# Patient Record
Sex: Female | Born: 2000
Health system: Southern US, Community
[De-identification: ages and names within clinical notes are randomized; demographics above are authoritative.]

## PROBLEM LIST (undated history)

## (undated) DIAGNOSIS — F32A Depression, unspecified: Secondary | ICD-10-CM

## (undated) DIAGNOSIS — F419 Anxiety disorder, unspecified: Secondary | ICD-10-CM

## (undated) DIAGNOSIS — D649 Anemia, unspecified: Secondary | ICD-10-CM

## (undated) DIAGNOSIS — N7011 Chronic salpingitis: Secondary | ICD-10-CM

## (undated) DIAGNOSIS — F329 Major depressive disorder, single episode, unspecified: Secondary | ICD-10-CM

## (undated) HISTORY — DX: Anxiety disorder, unspecified: F41.9

---
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
## 2018-01-08 MED FILL — NORETHIND-ETH ESTRAD 1-0.02: 1-20 | 63 days supply | Qty: 63 | Fill #3

## 2018-01-15 MED FILL — ESCITALOPRAM 20 MG TABLET: 20 | 10 days supply | Qty: 10 | Fill #1

## 2018-02-05 MED FILL — ESCITALOPRAM 20 MG TABLET: 20 | 90 days supply | Qty: 90 | Fill #2

## 2018-04-19 MED FILL — NORETHIND-ETH ESTRAD 1-0.02: 1-20 | 63 days supply | Qty: 63 | Fill #4

## 2018-04-25 ENCOUNTER — Encounter: Payer: Self-pay | Admitting: Family Medicine

## 2018-04-25 ENCOUNTER — Ambulatory Visit: Payer: 59 | Admitting: Family Medicine

## 2018-04-25 VITALS — BP 120/74 | HR 57 | Temp 98.6°F | Ht 65.5 in | Wt 153.2 lb

## 2018-04-25 DIAGNOSIS — F418 Other specified anxiety disorders: Secondary | ICD-10-CM | POA: Diagnosis not present

## 2018-04-25 MED ORDER — BUSPIRONE HCL 15 MG PO TABS: 15.0000 mg | ORAL_TABLET | Freq: Two times a day (BID) | ORAL | 3 refills | Status: DC

## 2018-04-25 MED ORDER — SERTRALINE HCL 50 MG PO TABS: 50.0000 mg | ORAL_TABLET | Freq: Every day | ORAL | 3 refills | Status: DC

## 2018-04-25 MED ORDER — ALPRAZOLAM 0.5 MG PO TABS: 0.5000 mg | ORAL_TABLET | Freq: Three times a day (TID) | ORAL | 0 refills | Status: DC | PRN

## 2018-04-25 NOTE — Patient Instructions (Addendum)
Go back to the zoloft 50 mg ( stop the lexapro)   Also add buspar - start with 1/2 pill twice daily , then if doing ok after 3-7 days increase to a whole pill twice daily   Then use xanax for emergencies/severe anxiety - up to three times a day    We will call you about referral for counseling   I hope your trip goes well   Be careful with caffeine   Follow up with me before you go back to school if you can

## 2018-04-25 NOTE — Progress Notes (Signed)
Subjective:    Patient ID: Rachel [name], female    DOB: February 20, 2001, 17 y.o.   MRN: [id number]  HPI Here for anxiety   Has seen a counselor in the past  Has taken zoloft and then changed to [place] she got worse instead of better  The zoloft and lexapro make her less irritable but she worries that they worsen her panic attacks  Also help with depressed mood    Is home from school and about to go on a long trip  (she does not like leaving home)  Going to [place] - wants to be excited about that   [place]- with grandparents [place]   (parents will not be on that leg of the trip) Family is not very helpful -saying she is being dramatic  Dad listens to her   Adjusting to being home from college  First day -rough/panic attacks  Now she has some bad days/some better   Panic attacks last 3 hours at least  She has tried meditation apps and journaling and keeping busy -just does not work   Wants to be set up with counseling when able    Has had counseling   Caffeine intake - does have some -not every day    Exercise -tennis and crossfit (enjoys it)   Patient Active Problem List   Diagnosis Date Noted  . Encounter for sickle-cell screening 06/05/2017  . Stress reaction 01/19/2016  . Well adolescent visit 10/26/2012  . Anxiety associated with depression 08/02/2011   Past Medical History:  Diagnosis Date  . Anxiety    Past Surgical History:  Procedure Laterality Date  . TONSILLECTOMY AND ADENOIDECTOMY  2006   Social History   Tobacco Use  . Smoking status: Never Smoker  . Smokeless tobacco: Never Used  Substance Use Topics  . Alcohol use: No    Alcohol/week: 0.0 oz  . Drug use: No   Family History  Problem Relation Age of Onset  . Anxiety disorder Mother   . Anxiety disorder Father   . Cancer Maternal Grandmother        breast CA  . Hyperlipidemia Maternal Grandmother   . Hypertension Maternal Grandfather   . Diabetes Paternal Grandmother   .  Hypertension Paternal Grandfather    No Known Allergies Current Outpatient Medications on File Prior to Visit  Medication Sig Dispense Refill  . Norgestimate-Eth Estradiol (ORTHO-CYCLEN, 28, PO) Take 1 tablet by mouth daily.     No current facility-administered medications on file prior to visit.     Review of Systems  Constitutional: Negative for activity change, appetite change, fatigue, fever and unexpected weight change.  HENT: Negative for congestion, ear pain, rhinorrhea, sinus pressure and sore throat.   Eyes: Negative for pain, redness and visual disturbance.  Respiratory: Negative for cough, shortness of breath and wheezing.   Cardiovascular: Negative for chest pain and palpitations.  Gastrointestinal: Negative for abdominal pain, blood in stool, constipation and diarrhea.  Endocrine: Negative for polydipsia and polyuria.  Genitourinary: Negative for dysuria, frequency and urgency.  Musculoskeletal: Negative for arthralgias, back pain and myalgias.  Skin: Negative for pallor and rash.  Allergic/Immunologic: Negative for environmental allergies.  Neurological: Negative for dizziness, syncope and headaches.  Hematological: Negative for adenopathy. Does not bruise/bleed easily.  Psychiatric/Behavioral: Positive for dysphoric mood. Negative for decreased concentration. The patient is nervous/anxious.        Objective:   Physical Exam  Constitutional: She appears well-developed and well-nourished. No distress.  Well  appearing   HENT:  Head: Normocephalic and atraumatic.  Mouth/Throat: Oropharynx is clear and moist.  Eyes: Pupils are equal, round, and reactive to light. Conjunctivae and EOM are normal.  Neck: Normal range of motion. Neck supple. No JVD present. Carotid bruit is not present. No thyromegaly present.  Cardiovascular: Normal rate, regular rhythm, normal heart sounds and intact distal pulses. Exam reveals no gallop.  Pulmonary/Chest: Effort normal and breath sounds  normal. No respiratory distress. She has no wheezes. She has no rales.  No crackles  Abdominal: Soft. Bowel sounds are normal. She exhibits no distension, no abdominal bruit and no mass. There is no tenderness.  Musculoskeletal: She exhibits no edema.  Lymphadenopathy:    She has no cervical adenopathy.  Neurological: She is alert. She has normal reflexes. She displays normal reflexes. No cranial nerve deficit. She exhibits normal muscle tone. Coordination normal.  Mild anxious hand tremor   Skin: Skin is warm and dry. Capillary refill takes less than 2 seconds. No rash noted.  Psychiatric: Her speech is normal and behavior is normal. Thought content normal. Her mood appears anxious. She exhibits a depressed mood.  Candid when discussing stressors Not tearful She is attentive.          Assessment & Plan:   Problem List Items Addressed This Visit      Other   Anxiety associated with depression - Primary    Worsening panic attacks with lexapro (although does well when not around parents and at school) Anxiety about upcoming trip out of the country Reviewed stressors/ coping techniques/symptoms/ support sources/ tx options and side effects in detail today Will change back to zoloft 50 mg  Add buspar 15 mg bid  Xanax for emergencies Ref for counseling (she already has a Social worker at school)  F/u end of school  Update if worse or no improvement       Relevant Medications   ALPRAZolam (XANAX) 0.5 MG tablet   sertraline (ZOLOFT) 50 MG tablet   busPIRone (BUSPAR) 15 MG tablet   Other Relevant Orders   Ambulatory referral to Psychology

## 2018-04-26 MED FILL — ALPRAZolam 0.5 MG TABS: 0.5 | 20 days supply | Qty: 60 | Fill #0

## 2018-04-26 MED FILL — busPIRone HCL 15 MG TABS: 15 | 90 days supply | Qty: 180 | Fill #0

## 2018-04-26 MED FILL — SERTRALINE HCL 50 MG TABLET: 50 | 90 days supply | Qty: 90 | Fill #0

## 2018-04-29 NOTE — Assessment & Plan Note (Signed)
Worsening panic attacks with lexapro (although does well when not around parents and at school) Anxiety about upcoming trip out of the country Reviewed stressors/ coping techniques/symptoms/ support sources/ tx options and side effects in detail today Will change back to zoloft 50 mg  Add buspar 15 mg bid  Xanax for emergencies Ref for counseling (she already has a Social worker at school)  F/u end of school  Update if worse or no improvement

## 2018-06-13 DIAGNOSIS — F411 Generalized anxiety disorder: Secondary | ICD-10-CM | POA: Diagnosis not present

## 2018-06-13 DIAGNOSIS — F331 Major depressive disorder, recurrent, moderate: Secondary | ICD-10-CM | POA: Diagnosis not present

## 2018-06-25 ENCOUNTER — Encounter: Payer: Self-pay | Admitting: Family Medicine

## 2018-06-25 ENCOUNTER — Ambulatory Visit: Payer: 59 | Admitting: Family Medicine

## 2018-06-25 VITALS — BP 118/84 | HR 82 | Temp 98.3°F | Ht 65.5 in | Wt 150.0 lb

## 2018-06-25 DIAGNOSIS — F418 Other specified anxiety disorders: Secondary | ICD-10-CM

## 2018-06-25 DIAGNOSIS — F331 Major depressive disorder, recurrent, moderate: Secondary | ICD-10-CM | POA: Diagnosis not present

## 2018-06-25 DIAGNOSIS — F411 Generalized anxiety disorder: Secondary | ICD-10-CM | POA: Diagnosis not present

## 2018-06-25 MED ORDER — SERTRALINE HCL 100 MG PO TABS: 100.0000 mg | ORAL_TABLET | Freq: Every day | ORAL | 3 refills | Status: DC

## 2018-06-25 NOTE — Progress Notes (Signed)
Subjective:    Patient ID: Rachel [name], female    DOB: 11-06-2001, 17 y.o.   MRN: [id number]  HPI Here for anxiety/depression symptoms  Ongoing problem  Worse around parents or when traveling Panic attacks -hard to control   Last visit went back to zoloft (from lexapro)  Added buspar 15 mg bid Xanax for emergencies  Ref for counseling  Poor sleep  No appetite   Wt Readings from Last 3 Encounters:  06/25/18 150 lb (68 kg) (81 %, Z= 0.89)*  04/25/18 153 lb 4 oz (69.5 kg) (84 %, Z= 1.00)*  11/08/17 146 lb 6.4 oz (66.4 kg) (80 %, Z= 0.84)*   * Growth percentiles are based on CDC (Girls, 2-20 Years) data.   24.58 kg/m (77 %, Z= 0.75, Source: CDC (Girls, 2-20 Years))   Here with her mother  Rachel [name] to the beach last week- mood was poor/ very anxious  Had to take xanax  Feels depressed a lot of the time   Still having panic attacks and they last a long time   Last med change did help -until recently  More depressed now   Goes back to school in 10 days  Worried about feeling this bad at school   Biggest stressor-issue with boyfriend -- does not feel the same   Has a therapist appointment in [place]   Patient Active Problem List   Diagnosis Date Noted  . Encounter for sickle-cell screening 06/05/2017  . Stress reaction 01/19/2016  . Well adolescent visit 10/26/2012  . Anxiety associated with depression 08/02/2011   Past Medical History:  Diagnosis Date  . Anxiety    Past Surgical History:  Procedure Laterality Date  . TONSILLECTOMY AND ADENOIDECTOMY  2006   Social History   Tobacco Use  . Smoking status: Never Smoker  . Smokeless tobacco: Never Used  Substance Use Topics  . Alcohol use: No    Alcohol/week: 0.0 standard drinks  . Drug use: No   Family History  Problem Relation Age of Onset  . Anxiety disorder Mother   . Anxiety disorder Father   . Cancer Maternal Grandmother        breast CA  . Hyperlipidemia Maternal Grandmother   .  Hypertension Maternal Grandfather   . Diabetes Paternal Grandmother   . Hypertension Paternal Grandfather    No Known Allergies Current Outpatient Medications on File Prior to Visit  Medication Sig Dispense Refill  . ALPRAZolam (XANAX) 0.5 MG tablet Take 1 tablet (0.5 mg total) by mouth 3 (three) times daily as needed for anxiety. 60 tablet 0  . busPIRone (BUSPAR) 15 MG tablet Take 1 tablet (15 mg total) by mouth 2 (two) times daily. 180 tablet 3  . Norgestimate-Eth Estradiol (ORTHO-CYCLEN, 28, PO) Take 1 tablet by mouth daily.     No current facility-administered medications on file prior to visit.     Review of Systems  Constitutional: Positive for fatigue. Negative for activity change, appetite change, fever and unexpected weight change.  HENT: Negative for congestion, ear pain, rhinorrhea, sinus pressure and sore throat.   Eyes: Negative for pain, redness and visual disturbance.  Respiratory: Negative for cough, shortness of breath and wheezing.   Cardiovascular: Negative for chest pain and palpitations.  Gastrointestinal: Negative for abdominal pain, blood in stool, constipation and diarrhea.  Endocrine: Negative for polydipsia and polyuria.  Genitourinary: Negative for dysuria, frequency and urgency.  Musculoskeletal: Negative for arthralgias, back pain and myalgias.  Skin: Negative for pallor and rash.  Allergic/Immunologic: Negative for environmental allergies.  Neurological: Negative for dizziness, syncope and headaches.  Hematological: Negative for adenopathy. Does not bruise/bleed easily.  Psychiatric/Behavioral: Positive for decreased concentration, dysphoric mood and sleep disturbance. Negative for self-injury and suicidal ideas. The patient is nervous/anxious.        Objective:   Physical Exam  Constitutional: She is oriented to person, place, and time. She appears well-developed and well-nourished. No distress.  Well appearing but down and occ tearful  HENT:  Head:  Normocephalic and atraumatic.  Eyes: Pupils are equal, round, and reactive to light. Conjunctivae are normal.  Neck: Normal range of motion. Neck supple. No thyromegaly present.  Cardiovascular: Normal rate, regular rhythm and normal heart sounds.  Pulmonary/Chest: Effort normal and breath sounds normal. No stridor. No respiratory distress. She has no wheezes.  Lymphadenopathy:    She has no cervical adenopathy.  Neurological: She is alert and oriented to person, place, and time. She displays no tremor and normal reflexes. No cranial nerve deficit. Coordination normal.  Skin: Skin is warm and dry.  Psychiatric: Her mood appears anxious. Her speech is delayed. She is slowed and withdrawn. She is not actively hallucinating. Thought content is not paranoid. Cognition and memory are normal. She exhibits a depressed mood. She expresses no homicidal and no suicidal ideation.  Tearful at times- slow to answer questions and more withdrawn than usual  Supportive mother is with her today helping with history   She is attentive.          Assessment & Plan:   Problem List Items Addressed This Visit      Other   Anxiety associated with depression - Primary    While anxiety was better controlled with combo of zoloft 50 and buspar -now depression is worsening  Anhedonia and difficulty sleeping/eating along with tearfulness  ? If issues with her boyfriend -making her sad  Sees her counselor in [place] today  Reviewed stressors/ coping techniques/symptoms/ support sources/ tx options and side effects in detail today (no thoughts of suicide or self harm)  Has good support  Disc ref to psychiatry with pt and her mother  They will see who her counselor recommends in that area (where she goes to school) and let us know so I can put in the referral  Disc s/s of SI with her mother/ family is attentive and supportive - inst to watch closely for worsening symptoms Will inc her sertraline to 75 mg for 1-2  weeks , then if tolerated 100 mg daily  Discussed expectations of SSRI medication including time to effectiveness and mechanism of action, also poss of side effects (early and late)- including mental fuzziness, weight or appetite change, nausea and poss of worse dep or anxiety (even suicidal thoughts)  Pt voiced understanding and will stop med and update if this occurs  (has tolerated it well so far)       Relevant Medications   sertraline (ZOLOFT) 100 MG tablet

## 2018-06-25 NOTE — Patient Instructions (Addendum)
Talk to your counselor today about available psychiatrists in that area and let me know/ give [place] some names  We can refer   Increase zoloft to 75 mg (1 1/2 pills) for 1-2 weeks -if no problems then go up to 2 pills daily (100 mg)  I will write a new px for the 100 mg pill   If suddenly worse or you feel suicidal-please alert [place] and get the the emergency room   Try to take care of yourself - keep talking to friends and family

## 2018-06-25 NOTE — Assessment & Plan Note (Signed)
While anxiety was better controlled with combo of zoloft 50 and buspar -now depression is worsening  Anhedonia and difficulty sleeping/eating along with tearfulness  ? If issues with her boyfriend -making her sad  Sees her counselor in [place] today  Reviewed stressors/ coping techniques/symptoms/ support sources/ tx options and side effects in detail today (no thoughts of suicide or self harm)  Has good support  Disc ref to psychiatry with pt and her mother  They will see who her counselor recommends in that area (where she goes to school) and let us know so I can put in the referral  Disc s/s of SI with her mother/ family is attentive and supportive - inst to watch closely for worsening symptoms Will inc her sertraline to 75 mg for 1-2 weeks , then if tolerated 100 mg daily  Discussed expectations of SSRI medication including time to effectiveness and mechanism of action, also poss of side effects (early and late)- including mental fuzziness, weight or appetite change, nausea and poss of worse dep or anxiety (even suicidal thoughts)  Pt voiced understanding and will stop med and update if this occurs  (has tolerated it well so far)

## 2018-06-26 ENCOUNTER — Telehealth: Payer: Self-pay

## 2018-06-26 NOTE — Telephone Encounter (Signed)
Copied from [place] [id number]. Topic: Referral - Question >> Jun 25, 2018  4:44 PM Antonieta Iba C wrote: Reason for CRM: Pt's mom called in to provide the psychiatrist information for a referral.    [facility]   Fax: [phone]    phone : [phone]  This is the location detail. Mom would like to be called once referral is placed.  Randolm Idol- [phone]  Spoke with patient's mother Randolm Idol and advised referral faxed over and to let us know what provider and when patient will see them once this is set up.-Anastasiya Estell Harpin, RMA

## 2018-06-27 ENCOUNTER — Ambulatory Visit: Payer: Self-pay | Admitting: Family Medicine

## 2018-06-27 DIAGNOSIS — F331 Major depressive disorder, recurrent, moderate: Secondary | ICD-10-CM | POA: Diagnosis not present

## 2018-06-27 DIAGNOSIS — F411 Generalized anxiety disorder: Secondary | ICD-10-CM | POA: Diagnosis not present

## 2018-07-04 DIAGNOSIS — F331 Major depressive disorder, recurrent, moderate: Secondary | ICD-10-CM | POA: Diagnosis not present

## 2018-07-04 DIAGNOSIS — F411 Generalized anxiety disorder: Secondary | ICD-10-CM | POA: Diagnosis not present

## 2018-07-05 DIAGNOSIS — F411 Generalized anxiety disorder: Secondary | ICD-10-CM | POA: Diagnosis not present

## 2018-07-05 MED FILL — NORETHIND-ETH ESTRAD 1-0.02: 1-20 | 63 days supply | Qty: 63 | Fill #0

## 2018-07-05 MED FILL — CLORAZEPATE 7.5 MG TABLET: 7.5 | 30 days supply | Qty: 90 | Fill #0

## 2018-07-05 MED FILL — buPROPion HCL ER (XL) 150 M: 150 | 30 days supply | Qty: 30 | Fill #0

## 2018-07-09 MED FILL — SERTRALINE HCL 50 MG TABLET: 50 | 30 days supply | Qty: 30 | Fill #0

## 2018-07-26 DIAGNOSIS — F332 Major depressive disorder, recurrent severe without psychotic features: Secondary | ICD-10-CM | POA: Diagnosis not present

## 2018-08-06 DIAGNOSIS — F332 Major depressive disorder, recurrent severe without psychotic features: Secondary | ICD-10-CM | POA: Diagnosis not present

## 2018-08-09 DIAGNOSIS — F332 Major depressive disorder, recurrent severe without psychotic features: Secondary | ICD-10-CM | POA: Diagnosis not present

## 2018-08-24 DIAGNOSIS — F332 Major depressive disorder, recurrent severe without psychotic features: Secondary | ICD-10-CM | POA: Diagnosis not present

## 2018-08-24 MED FILL — buPROPion HCL ER (XL) 300 M: 300 | 90 days supply | Qty: 90 | Fill #0

## 2018-08-24 MED FILL — ALPRAZolam 0.5 MG TABS: 0.5 | 20 days supply | Qty: 20 | Fill #0

## 2018-08-24 MED FILL — DESVENLAFAXINE SUCCINATE ER: 25 | 7 days supply | Qty: 7 | Fill #0

## 2018-08-24 MED FILL — DESVENLAFAXINE SUC ER 50 MG: 50 | 90 days supply | Qty: 90 | Fill #0

## 2018-09-14 DIAGNOSIS — F332 Major depressive disorder, recurrent severe without psychotic features: Secondary | ICD-10-CM | POA: Diagnosis not present

## 2018-09-14 MED FILL — NORETHIND-ETH ESTRAD 1-0.02: 1-20 | 63 days supply | Qty: 63 | Fill #0

## 2018-09-24 DIAGNOSIS — F332 Major depressive disorder, recurrent severe without psychotic features: Secondary | ICD-10-CM | POA: Diagnosis not present

## 2018-10-29 DIAGNOSIS — Z01419 Encounter for gynecological examination (general) (routine) without abnormal findings: Secondary | ICD-10-CM | POA: Diagnosis not present

## 2018-10-29 DIAGNOSIS — Z113 Encounter for screening for infections with a predominantly sexual mode of transmission: Secondary | ICD-10-CM | POA: Diagnosis not present

## 2018-10-29 DIAGNOSIS — Z6825 Body mass index (BMI) 25.0-25.9, adult: Secondary | ICD-10-CM | POA: Diagnosis not present

## 2018-10-29 DIAGNOSIS — Z1389 Encounter for screening for other disorder: Secondary | ICD-10-CM | POA: Diagnosis not present

## 2018-10-29 DIAGNOSIS — Z3040 Encounter for surveillance of contraceptives, unspecified: Secondary | ICD-10-CM | POA: Diagnosis not present

## 2018-11-12 MED FILL — NORETHIND-ETH ESTRAD 1-0.02: 1-20 | 63 days supply | Qty: 63 | Fill #0

## 2019-01-01 DIAGNOSIS — F332 Major depressive disorder, recurrent severe without psychotic features: Secondary | ICD-10-CM | POA: Diagnosis not present

## 2019-01-01 MED FILL — DESVENLAFAXINE SUC ER 100 M: 100 | 90 days supply | Qty: 90 | Fill #0

## 2019-01-01 MED FILL — ALPRAZolam 0.5 MG TABS: 0.5 | 20 days supply | Qty: 20 | Fill #0

## 2019-01-31 MED FILL — NORETHIND-ETH ESTRAD 1-0.02: 1-20 | 63 days supply | Qty: 63 | Fill #0

## 2019-02-22 MED FILL — buPROPion HCL ER (XL) 300 M: 300 | 90 days supply | Qty: 90 | Fill #0

## 2019-02-22 MED FILL — DESVENLAFAXINE SUC ER 100 M: 100 | 90 days supply | Qty: 90 | Fill #1

## 2019-03-13 ENCOUNTER — Telehealth: Payer: Self-pay | Admitting: *Deleted

## 2019-03-13 ENCOUNTER — Ambulatory Visit (INDEPENDENT_AMBULATORY_CARE_PROVIDER_SITE_OTHER): Payer: 59

## 2019-03-13 ENCOUNTER — Other Ambulatory Visit: Payer: Self-pay

## 2019-03-13 DIAGNOSIS — Z23 Encounter for immunization: Secondary | ICD-10-CM | POA: Diagnosis not present

## 2019-03-13 NOTE — Telephone Encounter (Signed)
Aware- let me know / we can do it drive through  Also she needs to watch closely for signs of infection-redness/pain /drainage Thanks

## 2019-03-13 NOTE — Telephone Encounter (Signed)
Please give Tdap  Adv to keep wound clean with soap and water- frequently and keep [place] updated

## 2019-03-13 NOTE — Telephone Encounter (Signed)
Patient notified as instructed by telephone and verbalized understanding. Nurse visit scheduled for today at 3:30.

## 2019-03-13 NOTE — Telephone Encounter (Signed)
FYI Patient called wanting to know when she had her last tetanus shot. Patient stated that she just stepped on a rusty nail. Advised patient there is not one listed on her immunization list. Patient stated that she may have had one before she went to college two years ago. Patient stated that she is going to check with her mom and will call back if she has not had one recently.

## 2019-03-13 NOTE — Telephone Encounter (Signed)
Patient called back stating that she was doing a hand stand and she actually poked her hand with a rusty nail. Patient stated that her mom told her if she did not have the vaccine here she would have not had one.

## 2019-04-18 MED FILL — NORETHIND-ETH ESTRAD 1-0.02: 1-20 | 63 days supply | Qty: 63 | Fill #1

## 2019-05-07 DIAGNOSIS — R140 Abdominal distension (gaseous): Secondary | ICD-10-CM | POA: Diagnosis not present

## 2019-05-07 DIAGNOSIS — R1033 Periumbilical pain: Secondary | ICD-10-CM | POA: Diagnosis not present

## 2019-05-17 MED FILL — buPROPion HCL ER (XL) 300 M: 300 | 90 days supply | Qty: 90 | Fill #1

## 2019-05-23 ENCOUNTER — Other Ambulatory Visit (HOSPITAL_COMMUNITY): Payer: Self-pay | Admitting: Gastroenterology

## 2019-05-23 ENCOUNTER — Other Ambulatory Visit: Payer: Self-pay | Admitting: Gastroenterology

## 2019-05-23 DIAGNOSIS — R1033 Periumbilical pain: Secondary | ICD-10-CM | POA: Diagnosis not present

## 2019-05-23 DIAGNOSIS — K5900 Constipation, unspecified: Secondary | ICD-10-CM | POA: Diagnosis not present

## 2019-05-28 DIAGNOSIS — F332 Major depressive disorder, recurrent severe without psychotic features: Secondary | ICD-10-CM | POA: Diagnosis not present

## 2019-05-28 MED FILL — DESVENLAFAXINE SUC ER 100 M: 100 | 90 days supply | Qty: 90 | Fill #0

## 2019-05-30 ENCOUNTER — Other Ambulatory Visit: Payer: Self-pay

## 2019-05-30 ENCOUNTER — Encounter (HOSPITAL_COMMUNITY): Payer: Self-pay | Admitting: Radiology

## 2019-05-30 ENCOUNTER — Ambulatory Visit (HOSPITAL_COMMUNITY)
Admission: RE | Admit: 2019-05-30 | Discharge: 2019-05-30 | Disposition: A | Discharge status: Home / Self Care | Payer: 59 | Source: Ambulatory Visit | Attending: Gastroenterology | Admitting: Gastroenterology

## 2019-05-30 DIAGNOSIS — N838 Other noninflammatory disorders of ovary, fallopian tube and broad ligament: Secondary | ICD-10-CM | POA: Diagnosis not present

## 2019-05-30 DIAGNOSIS — R1033 Periumbilical pain: Secondary | ICD-10-CM | POA: Diagnosis not present

## 2019-05-30 MED ORDER — SODIUM CHLORIDE (PF) 0.9 % IJ SOLN
INTRAMUSCULAR | Status: AC
  Filled 2019-05-30: qty 50

## 2019-05-30 MED ORDER — IOHEXOL 300 MG/ML  SOLN
100.0000 mL | Freq: Once | INTRAMUSCULAR | Status: AC | PRN
  Administered 2019-05-30: 13:00:00 100 mL via INTRAVENOUS

## 2019-06-05 ENCOUNTER — Encounter: Payer: Self-pay | Admitting: Family Medicine

## 2019-06-05 DIAGNOSIS — D7389 Other diseases of spleen: Secondary | ICD-10-CM | POA: Insufficient documentation

## 2019-06-27 DIAGNOSIS — N83291 Other ovarian cyst, right side: Secondary | ICD-10-CM | POA: Diagnosis not present

## 2019-07-01 MED FILL — NORETHIND-ETH ESTRAD 1-0.02: 1-20 | 63 days supply | Qty: 63 | Fill #2

## 2019-08-16 MED FILL — buPROPion HCL ER (XL) 300 M: 300 | 90 days supply | Qty: 90 | Fill #0

## 2019-08-20 MED FILL — DESVENLAFAXINE SUC ER 100 M: 100 | 90 days supply | Qty: 90 | Fill #0

## 2019-08-26 DIAGNOSIS — F332 Major depressive disorder, recurrent severe without psychotic features: Secondary | ICD-10-CM | POA: Diagnosis not present

## 2019-09-11 MED FILL — NORETHIND-ETH ESTRAD 1-0.02: 1-20 | 63 days supply | Qty: 63 | Fill #3

## 2019-09-24 DIAGNOSIS — Z20828 Contact with and (suspected) exposure to other viral communicable diseases: Secondary | ICD-10-CM | POA: Diagnosis not present

## 2019-11-01 DIAGNOSIS — N83201 Unspecified ovarian cyst, right side: Secondary | ICD-10-CM | POA: Diagnosis not present

## 2019-11-11 DIAGNOSIS — Z3040 Encounter for surveillance of contraceptives, unspecified: Secondary | ICD-10-CM | POA: Diagnosis not present

## 2019-11-11 DIAGNOSIS — Z01419 Encounter for gynecological examination (general) (routine) without abnormal findings: Secondary | ICD-10-CM | POA: Diagnosis not present

## 2019-11-11 DIAGNOSIS — Z1389 Encounter for screening for other disorder: Secondary | ICD-10-CM | POA: Diagnosis not present

## 2019-11-11 DIAGNOSIS — Z6823 Body mass index (BMI) 23.0-23.9, adult: Secondary | ICD-10-CM | POA: Diagnosis not present

## 2019-11-11 DIAGNOSIS — Z130 Encounter for screening for diseases of the blood and blood-forming organs and certain disorders involving the immune mechanism: Secondary | ICD-10-CM | POA: Diagnosis not present

## 2019-11-11 MED FILL — NORETHIND-ETH ESTRAD 1-0.02: 1-20 | 84 days supply | Qty: 84 | Fill #0

## 2019-11-22 MED FILL — buPROPion HCL ER (XL) 300 M: 300 | 30 days supply | Qty: 30 | Fill #0

## 2019-11-25 DIAGNOSIS — Z1159 Encounter for screening for other viral diseases: Secondary | ICD-10-CM | POA: Diagnosis not present

## 2019-12-08 ENCOUNTER — Telehealth: Payer: Self-pay | Admitting: Family Medicine

## 2019-12-08 NOTE — Telephone Encounter (Signed)
The radiologist recommended re imaging spleen in 6 months from last CT scan  She is due whenever she wants to schedule that Let me know when she is ready

## 2019-12-09 NOTE — Telephone Encounter (Signed)
Left VM requesting pt to call the office back 

## 2019-12-10 NOTE — Telephone Encounter (Signed)
Left 2nd VM requesting pt to call the office back  

## 2019-12-11 NOTE — Telephone Encounter (Signed)
Attempted to reach patient. No answer. Vm left to call back x3

## 2019-12-12 NOTE — Telephone Encounter (Signed)
Since pt never responded to our messages (x3) letter mailed with Dr. Marliss Coots comments

## 2019-12-16 ENCOUNTER — Encounter (HOSPITAL_BASED_OUTPATIENT_CLINIC_OR_DEPARTMENT_OTHER): Payer: Self-pay | Admitting: Obstetrics and Gynecology

## 2019-12-16 ENCOUNTER — Other Ambulatory Visit: Payer: Self-pay

## 2019-12-16 NOTE — Progress Notes (Signed)
Spoke w/ via phone for pre-op interview---Rachel [name] needs dos---- cbc, cmet, urine preg             [name] results------ COVID test ------12-16-2018 Arrive at -------530 am 12-20-2019 NPO after ------midnight Medications to take morning of surgery -----prestiq, wellbutrin Diabetic medication -----n/a Patient Special Instructions ----- Pre-Op special Istructions ----- Patient verbalized understanding of instructions that were given at this phone interview. Patient denies shortness of breath, chest pain, fever, cough a this phone interview.

## 2019-12-17 ENCOUNTER — Other Ambulatory Visit (HOSPITAL_COMMUNITY)
Admission: RE | Admit: 2019-12-17 | Discharge: 2019-12-17 | Disposition: A | Discharge status: Home / Self Care | Payer: 59 | Source: Ambulatory Visit | Attending: Obstetrics and Gynecology | Admitting: Obstetrics and Gynecology

## 2019-12-17 DIAGNOSIS — N906 Unspecified hypertrophy of vulva: Secondary | ICD-10-CM | POA: Diagnosis not present

## 2019-12-17 DIAGNOSIS — Z01812 Encounter for preprocedural laboratory examination: Secondary | ICD-10-CM | POA: Insufficient documentation

## 2019-12-17 DIAGNOSIS — Q504 Embryonic cyst of fallopian tube: Secondary | ICD-10-CM | POA: Diagnosis not present

## 2019-12-17 DIAGNOSIS — Z20822 Contact with and (suspected) exposure to covid-19: Secondary | ICD-10-CM | POA: Diagnosis not present

## 2019-12-17 LAB — SARS CORONAVIRUS 2 (TAT 6-24 HRS): SARS Coronavirus 2: NEGATIVE

## 2019-12-17 MED FILL — BUPROPION HCL XL 300 MG TAB: 300 | 30 days supply | Qty: 30 | Fill #1

## 2019-12-18 NOTE — H&P (Signed)
Rachel [name] is an 19 y.o. female G49 with congenital hydrosalpinx also labial hypertrophy for operative laparoscopy - removal of hydrosalpinx, also labiaplasty due to discomfort when active and with IC.  D/W pt r/b/a of surgery and process.    Pertinent Gynecological History: OB History: G0, P0 OCP for contra/menorrhagia No pap No STDs   Menstrual History:  Patient's last menstrual period was 12/05/2019.    Past Medical History:  Diagnosis Date  . Anemia   . Anxiety   . Depression   . Hydrosalpinx     Past Surgical History:  Procedure Laterality Date  . TONSILLECTOMY AND ADENOIDECTOMY  2006    Family History  Problem Relation Age of Onset  . Anxiety disorder Mother   . Anxiety disorder Father   . Cancer Maternal Grandmother        breast CA  . Hyperlipidemia Maternal Grandmother   . Hypertension Maternal Grandfather   . Diabetes Paternal Grandmother   . Hypertension Paternal Grandfather     Social History:  reports that she has never smoked. She has never used smokeless tobacco. She reports current alcohol use. She reports that she does not use drugs. single, in college  Allergies: No Known Allergies  No medications prior to admission.    Review of Systems  Constitutional: Negative.   HENT: Negative.   Eyes: Negative.   Respiratory: Negative.   Cardiovascular: Negative.   Gastrointestinal: Negative.   Genitourinary: Negative.   Musculoskeletal: Negative.   Skin: Negative.   Neurological: Negative.   Psychiatric/Behavioral: Negative.     Height 5\' 5"  (1.651 m), weight 61.2 kg, last menstrual period 12/05/2019. Physical Exam  Constitutional: She is oriented to person, place, and time. She appears well-developed and well-nourished.  HENT:  Head: Normocephalic and atraumatic.  Cardiovascular: Normal rate and regular rhythm.  Respiratory: Effort normal and breath sounds normal. No respiratory distress. She has no wheezes.  GI: Soft. Bowel sounds are  normal. She exhibits no distension. There is no abdominal tenderness.  Musculoskeletal:        General: Normal range of motion.  Neurological: She is alert and oriented to person, place, and time.  Skin: Skin is warm and dry.  Psychiatric: She has a normal mood and affect. Her behavior is normal.    .  Assessment/Plan: 19yo G0P0 for operative laparoscopy/excision of hydrosalpinx also labiaplasty D/w pt r/b/a, process and post op expectation   Rachel [name] 12/18/2019, 4:21 PM

## 2019-12-19 NOTE — Anesthesia Preprocedure Evaluation (Addendum)
Anesthesia Evaluation  Patient identified by MRN, date of birth, ID band Patient awake    Reviewed: Allergy & Precautions, NPO status , Patient's Chart, lab work & pertinent test results  Airway Mallampati: I  TM Distance: >3 FB Neck ROM: Full    Dental no notable dental hx. (+) Dental Advisory Given, Teeth Intact   Pulmonary neg pulmonary ROS,    Pulmonary exam normal breath sounds clear to auscultation       Cardiovascular negative cardio ROS Normal cardiovascular exam Rhythm:Regular Rate:Normal     Neuro/Psych PSYCHIATRIC DISORDERS Anxiety Depression negative neurological ROS     GI/Hepatic negative GI ROS, Neg liver ROS,   Endo/Other  negative endocrine ROS  Renal/GU negative Renal ROS     Musculoskeletal negative musculoskeletal ROS (+)   Abdominal   Peds  Hematology  (+) Blood dyscrasia, anemia ,   Anesthesia Other Findings   Reproductive/Obstetrics negative OB ROS                            Anesthesia Physical Anesthesia Plan  ASA: I  Anesthesia Plan: General   Post-op Pain Management:    Induction: Intravenous  PONV Risk Score and Plan: 4 or greater and Ondansetron, Dexamethasone, Scopolamine patch - Pre-op, Treatment may vary due to age or medical condition, Midazolam and Propofol infusion  Airway Management Planned: Oral ETT  Additional Equipment: None  Intra-op Plan:   Post-operative Plan: Extubation in OR  Informed Consent: I have reviewed the patients History and Physical, chart, labs and discussed the procedure including the risks, benefits and alternatives for the proposed anesthesia with the patient or authorized representative who has indicated his/her understanding and acceptance.     Dental advisory given  Plan Discussed with: CRNA  Anesthesia Plan Comments:        Anesthesia Quick Evaluation

## 2019-12-20 ENCOUNTER — Ambulatory Visit (HOSPITAL_BASED_OUTPATIENT_CLINIC_OR_DEPARTMENT_OTHER): Payer: 59 | Admitting: Anesthesiology

## 2019-12-20 ENCOUNTER — Ambulatory Visit (HOSPITAL_BASED_OUTPATIENT_CLINIC_OR_DEPARTMENT_OTHER)
Admission: RE | Admit: 2019-12-20 | Discharge: 2019-12-20 | Disposition: A | Discharge status: Home / Self Care | Payer: 59 | Source: Ambulatory Visit | Attending: Obstetrics and Gynecology | Admitting: Obstetrics and Gynecology

## 2019-12-20 ENCOUNTER — Encounter (HOSPITAL_BASED_OUTPATIENT_CLINIC_OR_DEPARTMENT_OTHER): Payer: Self-pay | Admitting: Obstetrics and Gynecology

## 2019-12-20 ENCOUNTER — Encounter (HOSPITAL_BASED_OUTPATIENT_CLINIC_OR_DEPARTMENT_OTHER)
Admission: RE | Disposition: A | Discharge status: Home / Self Care | Payer: Self-pay | Source: Ambulatory Visit | Attending: Obstetrics and Gynecology

## 2019-12-20 DIAGNOSIS — N906 Unspecified hypertrophy of vulva: Secondary | ICD-10-CM | POA: Insufficient documentation

## 2019-12-20 DIAGNOSIS — F329 Major depressive disorder, single episode, unspecified: Secondary | ICD-10-CM | POA: Insufficient documentation

## 2019-12-20 DIAGNOSIS — N838 Other noninflammatory disorders of ovary, fallopian tube and broad ligament: Secondary | ICD-10-CM | POA: Diagnosis not present

## 2019-12-20 DIAGNOSIS — F419 Anxiety disorder, unspecified: Secondary | ICD-10-CM | POA: Insufficient documentation

## 2019-12-20 DIAGNOSIS — R102 Pelvic and perineal pain: Secondary | ICD-10-CM | POA: Diagnosis not present

## 2019-12-20 DIAGNOSIS — N7011 Chronic salpingitis: Secondary | ICD-10-CM | POA: Insufficient documentation

## 2019-12-20 DIAGNOSIS — D649 Anemia, unspecified: Secondary | ICD-10-CM | POA: Diagnosis not present

## 2019-12-20 HISTORY — PX: UNILATERAL SALPINGECTOMY: SHX6160

## 2019-12-20 HISTORY — DX: Chronic salpingitis: N70.11

## 2019-12-20 HISTORY — DX: Depression, unspecified: F32.A

## 2019-12-20 HISTORY — PX: LABIOPLASTY: SHX1900

## 2019-12-20 HISTORY — DX: Anemia, unspecified: D64.9

## 2019-12-20 HISTORY — PX: LAPAROSCOPY: SHX197

## 2019-12-20 LAB — COMPREHENSIVE METABOLIC PANEL
ALT: 11 U/L (ref 0–44)
AST: 35 U/L (ref 15–41)
Albumin: 4.3 g/dL (ref 3.5–5.0)
Alkaline Phosphatase: 40 U/L (ref 38–126)
Anion gap: 9 (ref 5–15)
BUN: 18 mg/dL (ref 6–20)
CO2: 24 mmol/L (ref 22–32)
Calcium: 8.9 mg/dL (ref 8.9–10.3)
Chloride: 104 mmol/L (ref 98–111)
Creatinine, Ser: 1.01 mg/dL — ABNORMAL HIGH (ref 0.44–1.00)
GFR calc Af Amer: >60 mL/min (ref >60)
GFR calc non Af Amer: >60 mL/min (ref >60)
Glucose, Bld: 91 mg/dL (ref 70–99)
Potassium: 5.1 mmol/L (ref 3.5–5.1)
Sodium: 137 mmol/L (ref 135–145)
Total Bilirubin: 1.6 mg/dL — ABNORMAL HIGH (ref 0.3–1.2)
Total Protein: 7 g/dL (ref 6.5–8.1)

## 2019-12-20 LAB — CBC
HCT: 41.5 % (ref 36.0–46.0)
Hemoglobin: 13.7 g/dL (ref 12.0–15.0)
MCH: 30.2 pg (ref 26.0–34.0)
MCHC: 33 g/dL (ref 30.0–36.0)
MCV: 91.6 fL (ref 80.0–100.0)
Platelets: 248 10*3/uL (ref 150–400)
RBC: 4.53 MIL/uL (ref 3.87–5.11)
RDW: 12.5 % (ref 11.5–15.5)
WBC: 5.6 10*3/uL (ref 4.0–10.5)
nRBC: 0 % (ref 0.0–0.2)

## 2019-12-20 LAB — POCT PREGNANCY, URINE: Preg Test, Ur: NEGATIVE

## 2019-12-20 SURGERY — LAPAROSCOPY OPERATIVE
Anesthesia: General | Site: Vagina

## 2019-12-20 MED ORDER — DEXAMETHASONE SODIUM PHOSPHATE 10 MG/ML IJ SOLN
INTRAMUSCULAR | Status: DC | PRN
  Administered 2019-12-20: 10 mg via INTRAVENOUS

## 2019-12-20 MED ORDER — IBUPROFEN 600 MG PO TABS: 600.0000 mg | ORAL_TABLET | Freq: Four times a day (QID) | ORAL | 1 refills | Status: DC | PRN

## 2019-12-20 MED ORDER — LACTATED RINGERS IV SOLN
INTRAVENOUS | Status: DC
  Filled 2019-12-20: qty 1000

## 2019-12-20 MED ORDER — PROMETHAZINE HCL 25 MG/ML IJ SOLN
6.2500 mg | INTRAMUSCULAR | Status: DC | PRN
  Filled 2019-12-20: qty 1

## 2019-12-20 MED ORDER — MIDAZOLAM HCL 5 MG/5ML IJ SOLN
INTRAMUSCULAR | Status: DC | PRN
  Administered 2019-12-20: 2 mg via INTRAVENOUS

## 2019-12-20 MED ORDER — BUPIVACAINE HCL (PF) 0.5 % IJ SOLN
INTRAMUSCULAR | Status: DC | PRN
  Administered 2019-12-20: 20 mL

## 2019-12-20 MED ORDER — MEPERIDINE HCL 25 MG/ML IJ SOLN
6.2500 mg | INTRAMUSCULAR | Status: DC | PRN
  Filled 2019-12-20: qty 1

## 2019-12-20 MED ORDER — OXYCODONE HCL 5 MG PO TABS
5.0000 mg | ORAL_TABLET | Freq: Once | ORAL | Status: AC | PRN
  Administered 2019-12-20: 5 mg via ORAL
  Filled 2019-12-20: qty 1

## 2019-12-20 MED ORDER — DEXMEDETOMIDINE HCL IN NACL 200 MCG/50ML IV SOLN
INTRAVENOUS | Status: AC
  Filled 2019-12-20: qty 50

## 2019-12-20 MED ORDER — OXYCODONE HCL 5 MG PO TABS: 5.0000 mg | ORAL_TABLET | Freq: Four times a day (QID) | ORAL | 0 refills | Status: DC | PRN

## 2019-12-20 MED ORDER — LIDOCAINE 2% (20 MG/ML) 5 ML SYRINGE
INTRAMUSCULAR | Status: AC
  Filled 2019-12-20: qty 5

## 2019-12-20 MED ORDER — FENTANYL CITRATE (PF) 100 MCG/2ML IJ SOLN
INTRAMUSCULAR | Status: AC
  Filled 2019-12-20: qty 2

## 2019-12-20 MED ORDER — OXYCODONE HCL 5 MG PO TABS
ORAL_TABLET | ORAL | Status: AC
  Filled 2019-12-20: qty 1

## 2019-12-20 MED ORDER — ROCURONIUM BROMIDE 10 MG/ML (PF) SYRINGE
PREFILLED_SYRINGE | INTRAVENOUS | Status: AC
  Filled 2019-12-20: qty 10

## 2019-12-20 MED ORDER — LIDOCAINE-EPINEPHRINE 1 %-1:100000 IJ SOLN
INTRAMUSCULAR | Status: DC | PRN
  Administered 2019-12-20: 16 mL

## 2019-12-20 MED ORDER — PROPOFOL 10 MG/ML IV BOLUS
INTRAVENOUS | Status: AC
  Filled 2019-12-20: qty 20

## 2019-12-20 MED ORDER — KETOROLAC TROMETHAMINE 30 MG/ML IJ SOLN
30.0000 mg | Freq: Once | INTRAMUSCULAR | Status: DC | PRN
  Filled 2019-12-20: qty 1

## 2019-12-20 MED ORDER — DEXAMETHASONE SODIUM PHOSPHATE 10 MG/ML IJ SOLN
INTRAMUSCULAR | Status: AC
  Filled 2019-12-20: qty 1

## 2019-12-20 MED ORDER — LIDOCAINE 2% (20 MG/ML) 5 ML SYRINGE
INTRAMUSCULAR | Status: DC | PRN
  Administered 2019-12-20: 80 mg via INTRAVENOUS

## 2019-12-20 MED ORDER — ONDANSETRON HCL 4 MG/2ML IJ SOLN
INTRAMUSCULAR | Status: DC | PRN
  Administered 2019-12-20: 4 mg via INTRAVENOUS

## 2019-12-20 MED ORDER — HYDROMORPHONE HCL 1 MG/ML IJ SOLN
0.2500 mg | INTRAMUSCULAR | Status: DC | PRN
  Filled 2019-12-20: qty 0.5

## 2019-12-20 MED ORDER — SILVER SULFADIAZINE 1 % EX CREA
TOPICAL_CREAM | CUTANEOUS | Status: DC | PRN
  Administered 2019-12-20: 1 via TOPICAL

## 2019-12-20 MED ORDER — ONDANSETRON HCL 4 MG/2ML IJ SOLN
INTRAMUSCULAR | Status: AC
  Filled 2019-12-20: qty 2

## 2019-12-20 MED ORDER — MIDAZOLAM HCL 2 MG/2ML IJ SOLN
INTRAMUSCULAR | Status: AC
  Filled 2019-12-20: qty 2

## 2019-12-20 MED ORDER — FENTANYL CITRATE (PF) 100 MCG/2ML IJ SOLN
INTRAMUSCULAR | Status: DC | PRN
  Administered 2019-12-20 (×2): 50 ug via INTRAVENOUS

## 2019-12-20 MED ORDER — ROCURONIUM BROMIDE 10 MG/ML (PF) SYRINGE
PREFILLED_SYRINGE | INTRAVENOUS | Status: DC | PRN
  Administered 2019-12-20: 40 mg via INTRAVENOUS

## 2019-12-20 MED ORDER — OXYCODONE HCL 5 MG/5ML PO SOLN
5.0000 mg | Freq: Once | ORAL | Status: AC | PRN
  Filled 2019-12-20: qty 5

## 2019-12-20 MED ORDER — SUGAMMADEX SODIUM 200 MG/2ML IV SOLN
INTRAVENOUS | Status: DC | PRN
  Administered 2019-12-20: 150 mg via INTRAVENOUS

## 2019-12-20 MED ORDER — PROPOFOL 10 MG/ML IV BOLUS
INTRAVENOUS | Status: DC | PRN
  Administered 2019-12-20: 160 mg via INTRAVENOUS

## 2019-12-20 MED ORDER — ONDANSETRON HCL 4 MG/2ML IJ SOLN
4.0000 mg | Freq: Once | INTRAMUSCULAR | Status: AC
  Administered 2019-12-20: 4 mg via INTRAVENOUS
  Filled 2019-12-20: qty 2

## 2019-12-20 MED FILL — oxyCODONE HCL 5 MG TABS: 5 | 3 days supply | Qty: 15 | Fill #0

## 2019-12-20 MED FILL — IBUPROFEN 600 MG TABLET: 600 | 11 days supply | Qty: 45 | Fill #0

## 2019-12-20 SURGICAL SUPPLY — 52 items
ADH SKN CLS APL DERMABOND .7 (GAUZE/BANDAGES/DRESSINGS) ×2
BAG RETRIEVAL 10MM (BASKET) ×1
BLADE HEX COATED 2.75 (ELECTRODE) ×5 IMPLANT
BLADE SURG 15 STRL LF C SS BP (BLADE) ×3 IMPLANT
BLADE SURG 15 STRL SS (BLADE) ×3
CABLE HIGH FREQUENCY MONO STRZ (ELECTRODE) IMPLANT
CATH ROBINSON RED A/P 16FR (CATHETERS) ×5 IMPLANT
CLOTH BEACON ORANGE TIMEOUT ST (SAFETY) ×5 IMPLANT
COUNTER NEEDLE 1200 MAGNETIC (NEEDLE) IMPLANT
COVER MAYO STAND STRL (DRAPES) ×5 IMPLANT
COVER WAND RF STERILE (DRAPES) ×5 IMPLANT
DERMABOND ADVANCED (GAUZE/BANDAGES/DRESSINGS) ×2
DERMABOND ADVANCED .7 DNX12 (GAUZE/BANDAGES/DRESSINGS) ×3 IMPLANT
DRSG OPSITE POSTOP 3X4 (GAUZE/BANDAGES/DRESSINGS) IMPLANT
DURAPREP 26ML APPLICATOR (WOUND CARE) ×5 IMPLANT
ELECT REM PT RETURN 9FT ADLT (ELECTROSURGICAL) ×5
ELECTRODE REM PT RTRN 9FT ADLT (ELECTROSURGICAL) ×3 IMPLANT
GAUZE 4X4 16PLY RFD (DISPOSABLE) ×5 IMPLANT
GAUZE SPONGE 4X4 16PLY XRAY LF (GAUZE/BANDAGES/DRESSINGS) ×10 IMPLANT
GAUZE VASELINE 3X9 (GAUZE/BANDAGES/DRESSINGS) ×5 IMPLANT
GLOVE BIO SURGEON STRL SZ 6.5 (GLOVE) ×8 IMPLANT
GLOVE BIO SURGEONS STRL SZ 6.5 (GLOVE) ×2
GLOVE BIOGEL PI IND STRL 7.0 (GLOVE) ×9 IMPLANT
GLOVE BIOGEL PI INDICATOR 7.0 (GLOVE) ×6
GOWN STRL REUS W/TWL LRG LVL3 (GOWN DISPOSABLE) ×20 IMPLANT
NEEDLE HYPO 22GX1.5 SAFETY (NEEDLE) ×5 IMPLANT
NEEDLE INSUFFLATION 120MM (ENDOMECHANICALS) ×5 IMPLANT
NS IRRIG 500ML POUR BTL (IV SOLUTION) ×5 IMPLANT
PACK LAPAROSCOPY BASIN (CUSTOM PROCEDURE TRAY) ×5 IMPLANT
PACK VAGINAL MINOR WOMEN LF (CUSTOM PROCEDURE TRAY) IMPLANT
PAD OB MATERNITY 4.3X12.25 (PERSONAL CARE ITEMS) ×5 IMPLANT
PAD PREP 24X48 CUFFED NSTRL (MISCELLANEOUS) ×5 IMPLANT
PENCIL BUTTON HOLSTER BLD 10FT (ELECTRODE) ×5 IMPLANT
SET IRRIG TUBING LAPAROSCOPIC (IRRIGATION / IRRIGATOR) IMPLANT
SET TUBE SMOKE EVAC HIGH FLOW (TUBING) ×5 IMPLANT
SHEARS HARMONIC ACE PLUS 36CM (ENDOMECHANICALS) ×5 IMPLANT
SUT VIC AB 2-0 CT2 27 (SUTURE) IMPLANT
SUT VIC AB 3-0 SH 27 (SUTURE) ×16
SUT VIC AB 3-0 SH 27X BRD (SUTURE) ×12 IMPLANT
SUT VICRYL 0 UR6 27IN ABS (SUTURE) ×5 IMPLANT
SUT VICRYL 4-0 PS2 18IN ABS (SUTURE) ×10 IMPLANT
SYR CONTROL 10ML LL (SYRINGE) ×5 IMPLANT
SYS BAG RETRIEVAL 10MM (BASKET) ×4
SYSTEM BAG RETRIEVAL 10MM (BASKET) ×3 IMPLANT
TOWEL OR 17X26 10 PK STRL BLUE (TOWEL DISPOSABLE) ×10 IMPLANT
TRAY FOLEY W/BAG SLVR 14FR LF (SET/KITS/TRAYS/PACK) IMPLANT
TROCAR BALLN 12MMX100 BLUNT (TROCAR) ×5 IMPLANT
TROCAR XCEL NON-BLD 11X100MML (ENDOMECHANICALS) IMPLANT
TROCAR XCEL NON-BLD 5MMX100MML (ENDOMECHANICALS) ×10 IMPLANT
TUBING ARTHROSCOPY IRRIG 16FT (MISCELLANEOUS) ×5 IMPLANT
WARMER LAPAROSCOPE (MISCELLANEOUS) ×5 IMPLANT
YANKAUER SUCT BULB TIP NO VENT (SUCTIONS) ×5 IMPLANT

## 2019-12-20 NOTE — Anesthesia Procedure Notes (Signed)
Procedure Name: Intubation Date/Time: 12/20/2019 7:27 AM Performed by: Naysa Puskas D, CRNA Pre-anesthesia Checklist: Patient identified, Emergency Drugs available, Suction available and Patient being monitored Patient Re-evaluated:Patient Re-evaluated prior to induction Oxygen Delivery Method: Circle system utilized Preoxygenation: Pre-oxygenation with 100% oxygen Induction Type: IV induction Ventilation: Mask ventilation without difficulty Laryngoscope Size: Mac and 3 Grade View: Grade I Tube type: Oral Tube size: 7.0 mm Number of attempts: 1 Airway Equipment and Method: Stylet and Oral airway Placement Confirmation: ETT inserted through vocal cords under direct vision,  positive ETCO2 and breath sounds checked- equal and bilateral Secured at: 21 cm Tube secured with: Tape Dental Injury: Teeth and Oropharynx as per pre-operative assessment

## 2019-12-20 NOTE — Brief Op Note (Signed)
12/20/2019  9:04 AM  PATIENT:  Rachel [name]  19 y.o. female  PRE-OPERATIVE DIAGNOSIS:  Hydrosalpinx, hypertrophy of labia  POST-OPERATIVE DIAGNOSIS:  B paratubal cysts, hypertrophy of labia  PROCEDURE:  Procedure(s) with comments: LAPAROSCOPY OPERATIVE (N/A) - request RNFA Kassie RNFA confirmed on 11/22/19 CS LAPAROSCOPIC UNILATERAL SALPINGECTOMY (Left) LABIAPLASTY (N/A)  SURGEON:  Surgeon(s) and Role:    * Bovard-Stuckert, Jeral Fruit, MD - Primary  ASSISTANTS: Kassie RNFA   ANESTHESIA:   local and general  EBL:  Minimal; IVF and uop per anesthesia  BLOOD ADMINISTERED:none  DRAINS: none   LOCAL MEDICATIONS USED:  MARCAINE    and LIDOCAINE   SPECIMEN:  Source of Specimen:  B tubal cysts  DISPOSITION OF SPECIMEN:  PATHOLOGY  COUNTS:  YES  TOURNIQUET:  * No tourniquets in log *  DICTATION: .Other Dictation: Dictation Number [id number]  PLAN OF CARE: Admit to inpatient   PATIENT DISPOSITION:  PACU - hemodynamically stable.   Delay start of Pharmacological VTE agent (>24hrs) due to surgical blood loss or risk of bleeding: not applicable

## 2019-12-20 NOTE — Transfer of Care (Signed)
Immediate Anesthesia Transfer of Care Note  Patient: Rachel [name]  Procedure(s) Performed: LAPAROSCOPY OPERATIVE (N/A Abdomen) LAPAROSCOPIC UNILATERAL SALPINGECTOMY (Left Abdomen) LABIAPLASTY (N/A Vagina )  Patient Location: PACU  Anesthesia Type:General  Level of Consciousness: awake, alert  and oriented  Airway & Oxygen Therapy: Patient Spontanous Breathing and Patient connected to nasal cannula oxygen  Post-op Assessment: Report given to RN and Post -op Vital signs reviewed and stable  Post vital signs: Reviewed and stable  Last Vitals:  Vitals Value Taken Time  BP 132/84 12/20/19 0901  Temp    Pulse 90 12/20/19 0905  Resp 14 12/20/19 0905  SpO2 99 % 12/20/19 0905  Vitals shown include unvalidated device data.  Last Pain:  Vitals:   12/20/19 0549  TempSrc: Oral  PainSc: 0-No pain      Patients Stated Pain Goal: 6 ([id number])  Complications: No apparent anesthesia complications

## 2019-12-20 NOTE — Interval H&P Note (Signed)
History and Physical Interval Note:  12/20/2019 7:05 AM  Rachel [name]  has presented today for surgery, with the diagnosis of hydrosalpinx.  The various methods of treatment have been discussed with the patient and family. After consideration of risks, benefits and other options for treatment, the patient has consented to  Procedure(s) with comments: LAPAROSCOPY OPERATIVE (N/A) - request RNFA Kassie RNFA confirmed on 11/22/19 CS LAPAROSCOPIC UNILATERAL SALPINGECTOMY (Left) LABIAPLASTY (N/A) as a surgical intervention.  The patient's history has been reviewed, patient examined, no change in status, stable for surgery.  I have reviewed the patient's chart and labs.  Questions were answered to the patient's satisfaction.     [name]

## 2019-12-20 NOTE — Interval H&P Note (Signed)
History and Physical Interval Note:  12/20/2019 7:05 AM  Rachel [name]  has presented today for surgery, with the diagnosis of hydrosalpinx.  The various methods of treatment have been discussed with the patient and family. After consideration of risks, benefits and other options for treatment, the patient has consented to  Procedure(s) with comments: LAPAROSCOPY OPERATIVE (N/A) - request RNFA Kassie RNFA confirmed on 11/22/19 CS LAPAROSCOPIC UNILATERAL SALPINGECTOMY (Left) LABIAPLASTY (N/A) as a surgical intervention.  The patient's history has been reviewed, patient examined, no change in status, stable for surgery.  I have reviewed the patient's chart and labs.  Questions were answered to the patient's satisfaction.     [name]

## 2019-12-20 NOTE — Discharge Instructions (Signed)

## 2019-12-20 NOTE — Anesthesia Postprocedure Evaluation (Signed)
Anesthesia Post Note  Patient: Rachel [name]  Procedure(s) Performed: LAPAROSCOPY OPERATIVE (N/A Abdomen) LAPAROSCOPIC UNILATERAL SALPINGECTOMY (Left Abdomen) LABIAPLASTY (N/A Vagina )     Patient location during evaluation: PACU Anesthesia Type: General Level of consciousness: sedated and patient cooperative Pain management: pain level controlled Vital Signs Assessment: post-procedure vital signs reviewed and stable Respiratory status: spontaneous breathing Cardiovascular status: stable Anesthetic complications: yes Anesthetic complication details: PONV   Last Vitals:  Vitals:   12/20/19 0945 12/20/19 1030  BP: 117/81 105/69  Pulse: 80 75  Resp: 12 14  Temp:  36.6 C  SpO2: 97% 93%    Last Pain:  Vitals:   12/20/19 0954  TempSrc:   PainSc: [place]

## 2019-12-20 NOTE — Op Note (Signed)
NAME: Rachel [name], Rachel [name] MEDICAL RECORD [id number] ACCOUNT [id number] DATE OF BIRTH:[birth year] FACILITY: [facility] LOCATION: [facility] PHYSICIAN:Almin Livingstone BOVARD-STUCKERT, MD  OPERATIVE REPORT  DATE OF PROCEDURE:  12/20/2019  PREOPERATIVE DIAGNOSES:  Hydrosalpinx, hypertrophy of right labia.  POSTOPERATIVE DIAGNOSES:  Bilateral paratubal cysts, hypertrophy of right labia.  PROCEDURE:  Operative laparoscopy, bilateral tubal cystectomy/hydrosalpinx at proximal end of tubes as well as a right labioplasty.  SURGEON:  Thornell Sartorius, MD   ASSISTANTRubin Payor, RNFA  ANESTHESIA:  Local and general.  ESTIMATED BLOOD LOSS:  Minimal.  IV FLUIDS:  Per anesthesia.  URINE OUTPUT:  Per anesthesia.  COMPLICATIONS:  None.  PATHOLOGY:  Bilateral tubal cysts.  DESCRIPTION OF PROCEDURE:  After informed consent was reviewed with the patient and her family including risks, benefits and alternatives, she was transported to the operating room and placed on the table in supine position.  General anesthesia was  induced and found to be adequate.  She was then placed in the [place]in stirrups, prepped and draped in normal sterile fashion.  Her bladder was sterilely drained.  A Hulka tenaculum was placed with an open-sided speculum without complication.  The gloves  and gown were changed.  Attention was turned to the abdominal portion of the case.  An approximately 1 cm infraumbilical incision was made, unable to get pneumoperitoneum with a Veress catheter.  Therefore, open laparoscopy was performed with the  balloon Hasson.  Pelvic survey performed revealed normal appearing tubes bilaterally except for paratubal cysts at the fimbriae.  These were noted to be bilateral, approximately 4 cm on the right, and 2 on the left, 1 to 2 cm and the other 2 cm x 3 cm.   The pelvic survey performed revealed normal liver edge, normal gallbladder, normal uterus, and tubes; otherwise as well as normal ovaries.  The decision was  made.  The tubes were elevated with atraumatic grasper and the tubal cyst was excised.  In the  excision it was noted that it was possible the fimbrae were removed with the cyst.  Cysts were placed in an EndoCatch bag and lifted to the level of the umbilical incision, drained in the bag and then removed.  The fluid was clear and straw-like.  After  the right cysts were removed, together they were placed in the anterior cul-de-sac.  The left cysts were removed and placed in the anterior cul-de-sac.  Attention was turned to the right.  This was noted to be a larger cyst.  It was attempted to be  shelled out; however, was partially drained in the process, again clear-appearing fluid.  The cyst was placed in the anterior cul-de-sac and then in the EndoCatch bag and drained at the skin edge.  The tubes were noted to be hemostatic.  The instruments  and gas was removed from the abdomen.  The fascia was closed with a 0 suture that had been placed prior to placement of the balloon Hasson.  Skin was closed with 4-0 Vicryl in a subcuticular fashion.  Dermabond was also applied.  Attention was turned to  the vaginal portion of the case.  The labia on her right was noted to be enlarged.  It was outlined with marking pen and then 10 mL of lidocaine was placed.  The skin was removed sharply.  A deep suture of 3-0 Vicryl was placed and then the skin at had  approximately 6-8 interrupted sutures of 3-0 Vicryl.  More lidocaine was placed and Silvadene ointment was also placed to the incision.  The patient was awakened in stable condition, transported to the PACU.  Sponge, laps, and needle counts were correct x2.  CN/NUANCE  D:12/20/2019 T:12/20/2019 JOB:[id number]

## 2019-12-23 ENCOUNTER — Telehealth: Payer: Self-pay | Admitting: *Deleted

## 2019-12-23 DIAGNOSIS — Z1152 Encounter for screening for COVID-19: Secondary | ICD-10-CM | POA: Diagnosis not present

## 2019-12-23 DIAGNOSIS — Z1159 Encounter for screening for other viral diseases: Secondary | ICD-10-CM | POA: Diagnosis not present

## 2019-12-23 DIAGNOSIS — D7389 Other diseases of spleen: Secondary | ICD-10-CM

## 2019-12-23 LAB — SURGICAL PATHOLOGY

## 2019-12-23 NOTE — Telephone Encounter (Signed)
If you can now reach her on the phone- express that we have been trying to reach her (thus, the letter)  ? Phone number issue There was a very small area on spleen on CT- incidental and most likely nothing but radiologist wanted to re check it   If you do reach her ask if and when she wants to schedule

## 2019-12-23 NOTE — Telephone Encounter (Signed)
Called pt and phone went straight to VM (again), left another VM requesting pt to call the office back

## 2019-12-23 NOTE — Telephone Encounter (Signed)
Pt notified of why we called, she agrees with the order for the CT f/u, please put order in and I advised pt our [facility] will call to schedule appt

## 2019-12-23 NOTE — Telephone Encounter (Signed)
FYI: we called pt at least 3 times regarding having the CT scan. She never answered our call or called [place] back so then I mailed letter letting her know she's due for the f/u CT and requested her to call back regarding getting CT scan. And she never did

## 2019-12-23 NOTE — Telephone Encounter (Signed)
Order done-will route to [facility]

## 2019-12-23 NOTE — Telephone Encounter (Signed)
Patient left a voicemail stating that she got a letter from Dr. Glori Bickers regarding her CT scan. Patient stated that she has not gotten any calls. Patient stated that she was never told there was any concerns about the CT scan and her spleen. Patient stated that she had surgery Friday to have a cyst removed from her fallopian tube. Patient requested a call back.

## 2019-12-24 NOTE — Telephone Encounter (Signed)
CT Scan scheduled at [facility] , patient aware.

## 2019-12-30 MED FILL — IBUPROFEN 600 MG TABLET: 600 | 11 days supply | Qty: 45 | Fill #1

## 2020-01-07 MED FILL — DESVENLAFAXINE SUC ER 100 M: 100 | 90 days supply | Qty: 90 | Fill #0

## 2020-01-08 ENCOUNTER — Ambulatory Visit (INDEPENDENT_AMBULATORY_CARE_PROVIDER_SITE_OTHER)
Admission: RE | Admit: 2020-01-08 | Discharge: 2020-01-08 | Disposition: A | Discharge status: Home / Self Care | Payer: 59 | Source: Ambulatory Visit | Attending: Family Medicine | Admitting: Family Medicine

## 2020-01-08 ENCOUNTER — Other Ambulatory Visit: Payer: Self-pay

## 2020-01-08 DIAGNOSIS — D7389 Other diseases of spleen: Secondary | ICD-10-CM

## 2020-01-08 MED ORDER — IOHEXOL 300 MG/ML  SOLN
100.0000 mL | Freq: Once | INTRAMUSCULAR | Status: AC | PRN
  Administered 2020-01-08: 16:00:00 100 mL via INTRAVENOUS

## 2020-01-09 ENCOUNTER — Encounter: Payer: Self-pay | Admitting: Family Medicine

## 2020-01-09 ENCOUNTER — Encounter: Payer: Self-pay | Admitting: *Deleted

## 2020-01-14 DIAGNOSIS — F332 Major depressive disorder, recurrent severe without psychotic features: Secondary | ICD-10-CM | POA: Diagnosis not present

## 2020-01-14 MED FILL — BuPROPion HCL ER (XL) 300 M: 300 | 30 days supply | Qty: 30 | Fill #0

## 2020-01-20 DIAGNOSIS — Z1152 Encounter for screening for COVID-19: Secondary | ICD-10-CM | POA: Diagnosis not present

## 2020-01-27 DIAGNOSIS — Z1152 Encounter for screening for COVID-19: Secondary | ICD-10-CM | POA: Diagnosis not present

## 2020-01-28 MED FILL — NORETHIND-ETH ESTRAD 1-0.02: 1-20 | 84 days supply | Qty: 84 | Fill #1

## 2020-02-14 DIAGNOSIS — Z1152 Encounter for screening for COVID-19: Secondary | ICD-10-CM | POA: Diagnosis not present

## 2020-02-21 MED FILL — buPROPion HCL ER (XL) 300 M: 300 | 30 days supply | Qty: 30 | Fill #1

## 2020-02-24 DIAGNOSIS — Z1152 Encounter for screening for COVID-19: Secondary | ICD-10-CM | POA: Diagnosis not present

## 2020-02-26 DIAGNOSIS — Z1152 Encounter for screening for COVID-19: Secondary | ICD-10-CM | POA: Diagnosis not present

## 2020-03-16 MED FILL — buPROPion HCL ER (XL) 300 M: 300 | 30 days supply | Qty: 30 | Fill #2

## 2020-04-23 DIAGNOSIS — F332 Major depressive disorder, recurrent severe without psychotic features: Secondary | ICD-10-CM | POA: Diagnosis not present

## 2020-05-14 DIAGNOSIS — F332 Major depressive disorder, recurrent severe without psychotic features: Secondary | ICD-10-CM | POA: Diagnosis not present

## 2020-05-18 IMAGING — CT CT ABDOMEN W/ CM
2 of 4 series · 16 of 46 positions shown, 18 images · IV contrast (OMNIPAQUE 300)
Comparison: 05/30/2019

CLINICAL DATA: Follow-up splenic lesion

EXAM:
CT ABDOMEN WITH CONTRAST
TECHNIQUE: Multidetector CT imaging of the abdomen was performed using the
standard protocol following bolus administration of intravenous
contrast.
CONTRAST:  100mL OMNIPAQUE IOHEXOL 300 MG/ML  SOLN

[Series 2: abdomen w 5.0 br40 2 · axial · 0.65mm/px · z∈[-306,-101]mm · 13 of 47 slices shown, 15 images]
[im 3/47  soft-tissue]
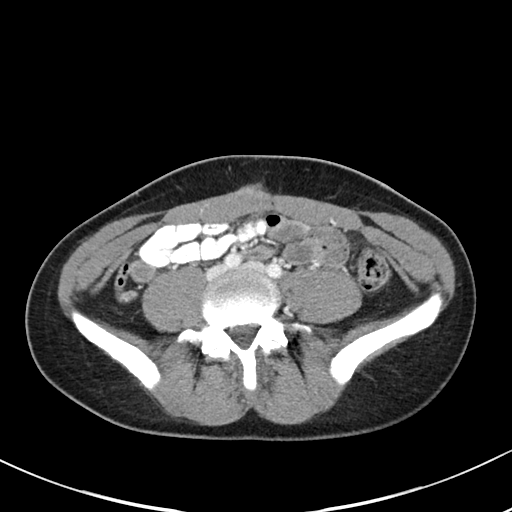
[im 3/47  bone]
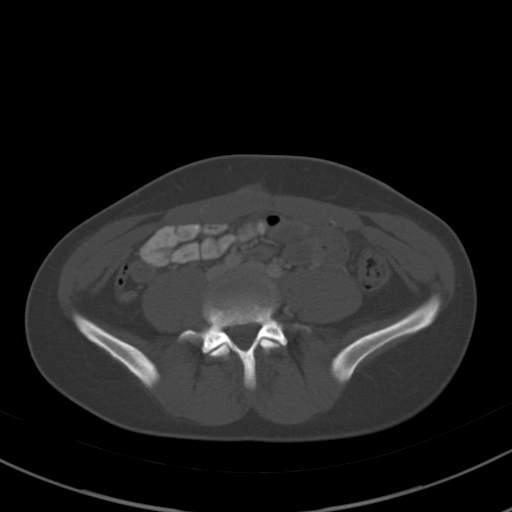
[im 7/47  soft-tissue]
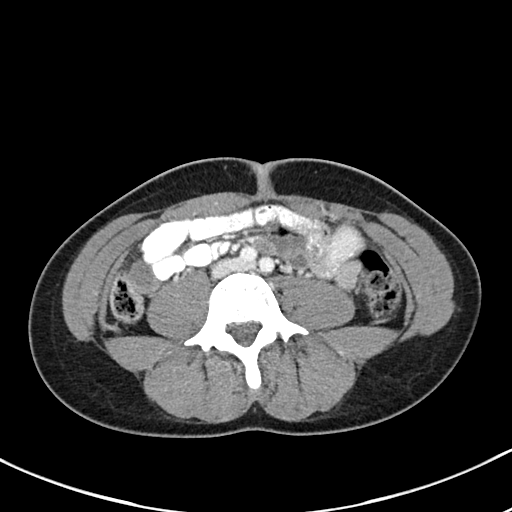
[im 10/47  soft-tissue]
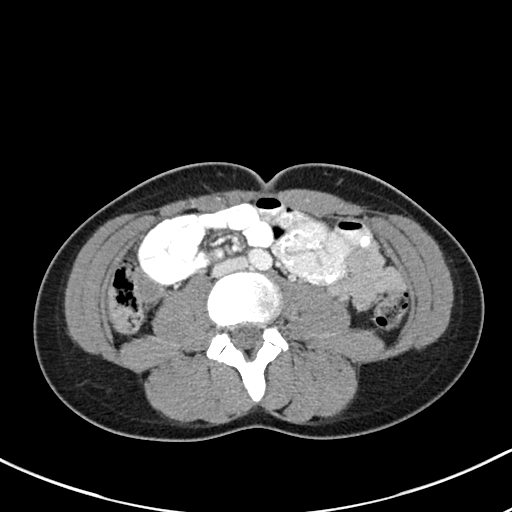
[im 14/47  soft-tissue]
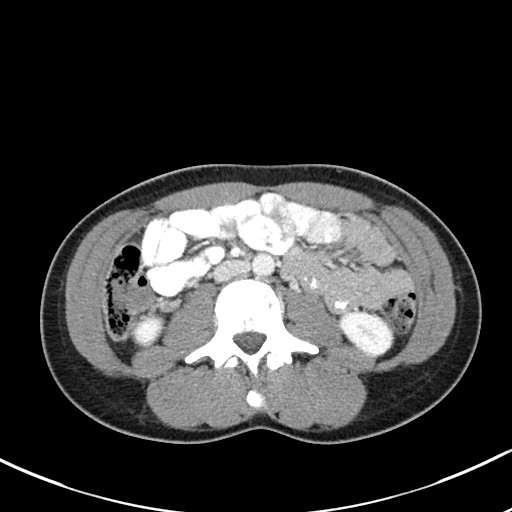
[im 17/47  soft-tissue]
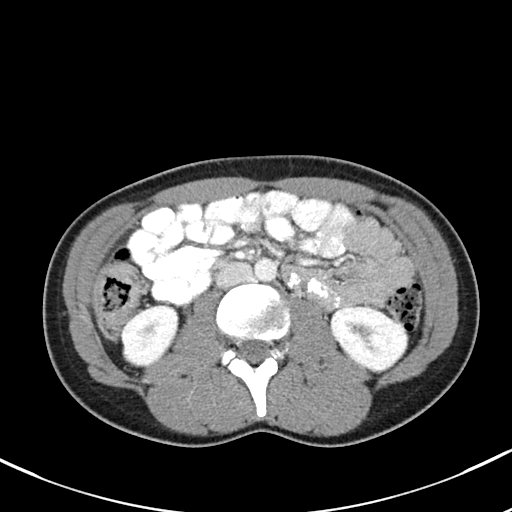
[im 21/47  soft-tissue]
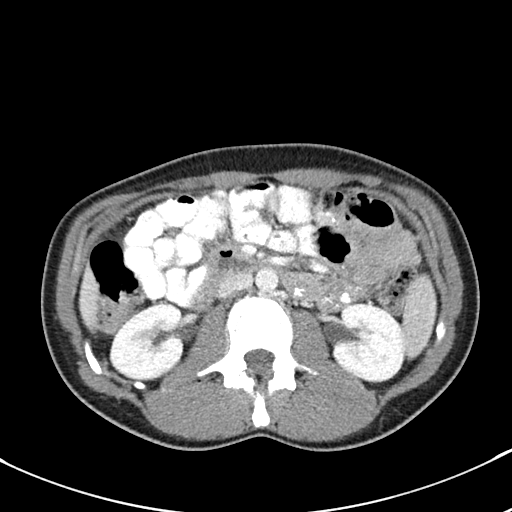
[im 24/47  soft-tissue]
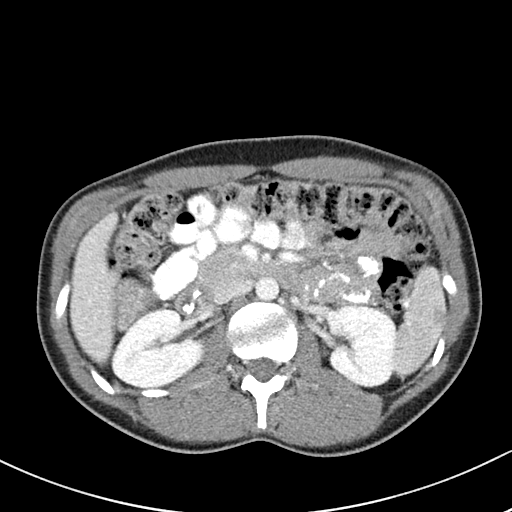
[im 26/47  soft-tissue]
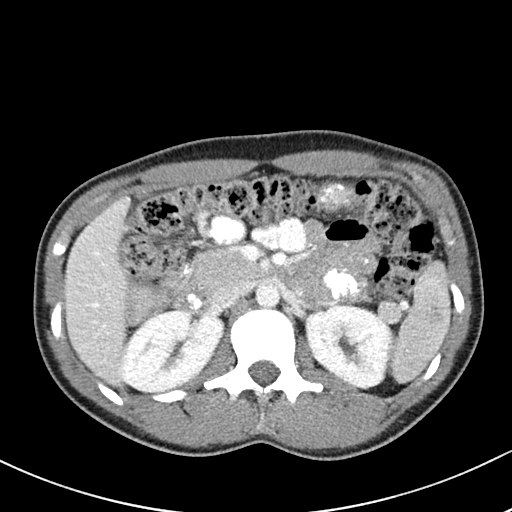
[im 30/47  soft-tissue]
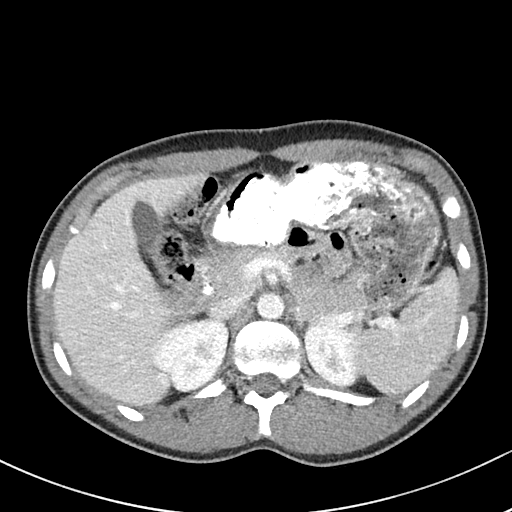
[im 30/47  bone]
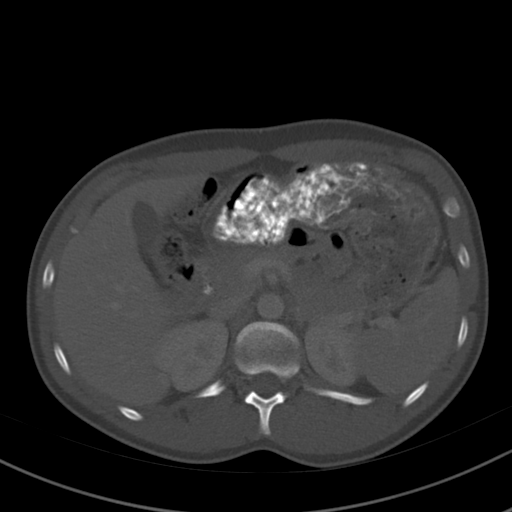
[im 33/47  soft-tissue]
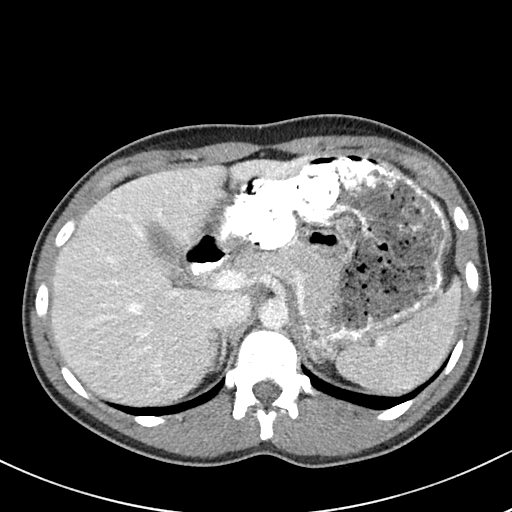
[im 37/47  soft-tissue]
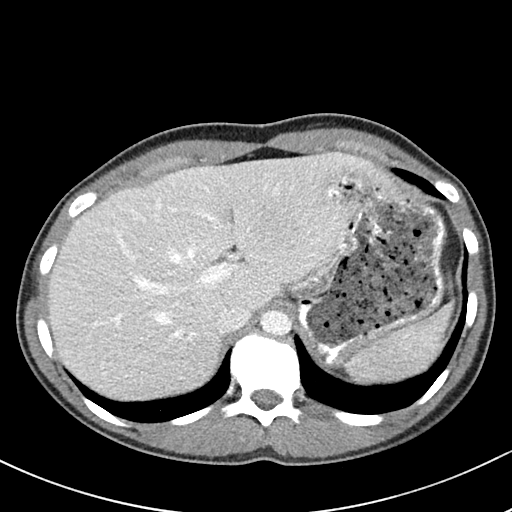
[im 40/47  soft-tissue]
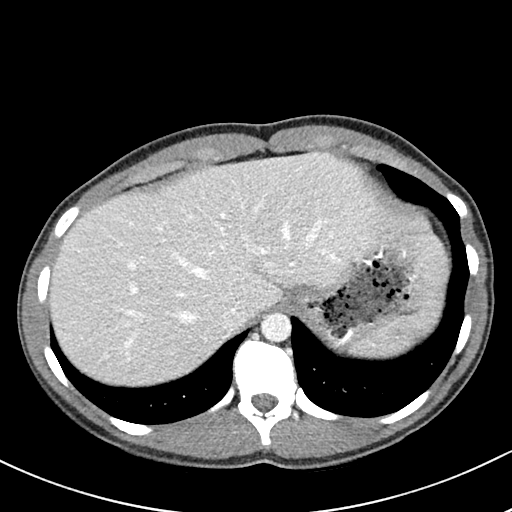
[im 44/47  soft-tissue]
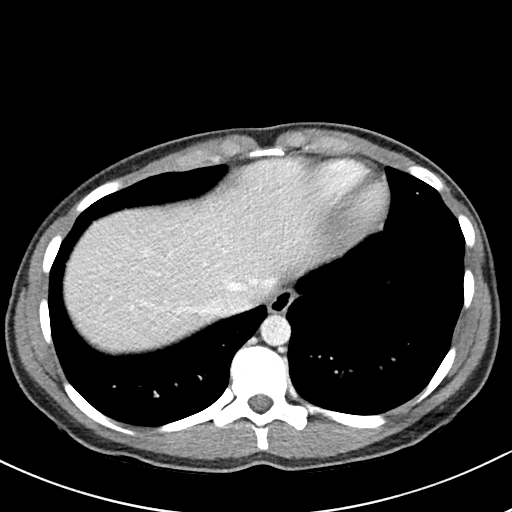

[Series 5: coronal · coronal · 0.45mm/px · 3 of 71 slices shown]
[im 24/71  soft-tissue]
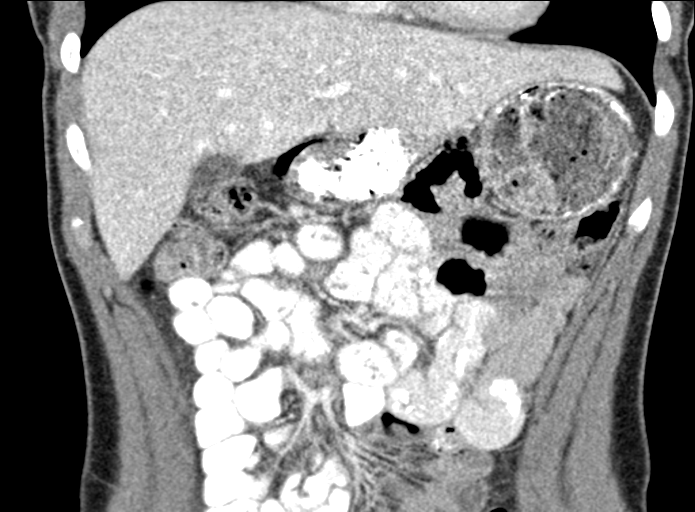
[im 32/71  soft-tissue]
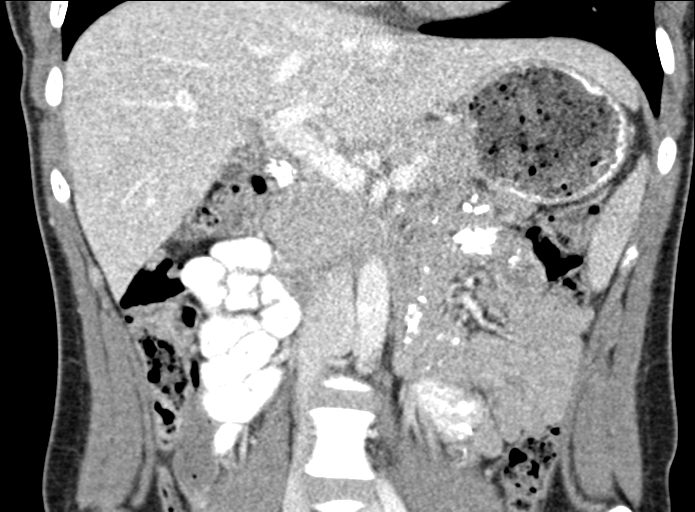
[im 39/71  soft-tissue]
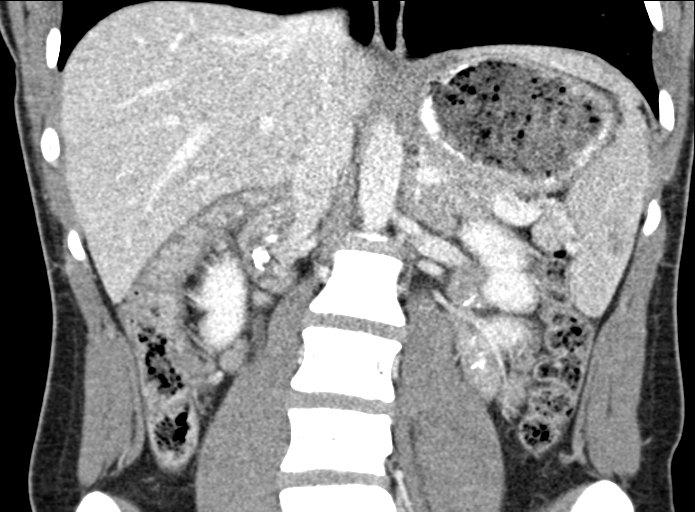

[16 of 46 positions shown; findings below may reference images not displayed]

FINDINGS: Lower chest: No acute abnormality.

Hepatobiliary: No focal liver abnormality is seen. No gallstones,
gallbladder wall thickening, or biliary dilatation.

Pancreas: Unremarkable. No pancreatic ductal dilatation or
surrounding inflammatory changes.

Spleen: 1.2 cm low-density structure within spleen is unchanged from
previous exam, image [DATE].

Adrenals/Urinary Tract: Normal adrenal glands. No kidney mass or
hydronephrosis.

Stomach/Bowel: Stomach is within normal limits. Appendix appears
normal. No evidence of bowel wall thickening, distention, or
inflammatory changes.

Vascular/Lymphatic: No significant vascular findings are present. No
enlarged abdominal or pelvic lymph nodes.

Other: No abdominal wall hernia or abnormality. No abdominopelvic
ascites.

Musculoskeletal: No acute or significant osseous findings.
IMPRESSION: 1. No acute findings within the abdomen.
2. Stable low-density structure within the spleen measuring 1.2 cm.
Likely benign abnormality.

## 2020-05-25 MED FILL — buPROPion HCL ER (XL) 300 M: 300 | 30 days supply | Qty: 30 | Fill #4

## 2020-05-25 MED FILL — DESVENLAFAXINE SUC ER 100 M: 100 | 30 days supply | Qty: 30 | Fill #0

## 2020-06-22 MED FILL — DESVENLAFAXINE SUC ER 100 M: 100 | 30 days supply | Qty: 30 | Fill #1

## 2020-07-02 MED FILL — buPROPion HCL ER (XL) 300 M: 300 | 30 days supply | Qty: 30 | Fill #5

## 2020-07-06 ENCOUNTER — Other Ambulatory Visit (HOSPITAL_COMMUNITY): Payer: Self-pay | Admitting: Dermatology

## 2020-07-06 DIAGNOSIS — L249 Irritant contact dermatitis, unspecified cause: Secondary | ICD-10-CM | POA: Diagnosis not present

## 2020-07-06 DIAGNOSIS — L700 Acne vulgaris: Secondary | ICD-10-CM | POA: Diagnosis not present

## 2020-07-06 MED FILL — SPIRONOLACTONE 50 MG TABLET: 50 | 30 days supply | Qty: 60 | Fill #0

## 2020-07-06 MED FILL — DESONIDE 0.05% CREAM: 0.05 | 7 days supply | Qty: 15 | Fill #0

## 2020-07-14 MED FILL — NORETHIND-ETH ESTRAD 1-0.02: 1-20 | 84 days supply | Qty: 84 | Fill #3

## 2020-07-16 MED FILL — DESVENLAFAXINE SUC ER 100 M: 100 | 30 days supply | Qty: 30 | Fill #2

## 2020-08-03 MED FILL — SPIRONOLACTONE 50 MG TABLET: 50 | 30 days supply | Qty: 60 | Fill #1

## 2020-08-06 MED FILL — buPROPion HCL ER (XL) 300 M: 300 | 30 days supply | Qty: 30 | Fill #2

## 2020-08-20 ENCOUNTER — Other Ambulatory Visit (HOSPITAL_COMMUNITY): Payer: Self-pay | Admitting: Psychiatry

## 2020-08-20 DIAGNOSIS — F332 Major depressive disorder, recurrent severe without psychotic features: Secondary | ICD-10-CM | POA: Diagnosis not present

## 2020-08-20 MED FILL — DESVENLAFAXINE SUC ER 100 M: 100 | 90 days supply | Qty: 90 | Fill #0

## 2020-08-20 MED FILL — ALPRAZolam 0.5 MG TABS: 0.5 | 20 days supply | Qty: 20 | Fill #0

## 2020-08-29 MED FILL — SPIRONOLACTONE 50 MG TABLET: 50 | 30 days supply | Qty: 60 | Fill #2

## 2020-09-07 MED FILL — ALPRAZolam 0.5 MG TABS: 0.5 | 20 days supply | Qty: 20 | Fill #1

## 2020-09-09 MED FILL — buPROPion HCL ER (XL) 300 M: 300 | 30 days supply | Qty: 30 | Fill #0

## 2020-09-27 MED FILL — ALPRAZolam 0.5 MG TABS: 0.5 | 20 days supply | Qty: 20 | Fill #2

## 2020-09-28 MED FILL — SPIRONOLACTONE 50 MG TABLET: 50 | 30 days supply | Qty: 60 | Fill #3

## 2020-10-01 DIAGNOSIS — F332 Major depressive disorder, recurrent severe without psychotic features: Secondary | ICD-10-CM | POA: Diagnosis not present

## 2020-10-03 MED FILL — buPROPion HCL ER (XL) 300 M: 300 | 30 days supply | Qty: 30 | Fill #1

## 2020-10-06 MED FILL — NORETHIND-ETH ESTRAD 1-0.02: 1-20 | 84 days supply | Qty: 84 | Fill #4

## 2020-10-12 DIAGNOSIS — L700 Acne vulgaris: Secondary | ICD-10-CM | POA: Diagnosis not present

## 2020-10-16 MED FILL — buPROPion HCL ER (XL) 300 M: 300 | 30 days supply | Qty: 30 | Fill #1

## 2020-10-19 ENCOUNTER — Other Ambulatory Visit (HOSPITAL_COMMUNITY): Payer: Self-pay | Admitting: Psychiatry

## 2020-10-19 MED FILL — ALPRAZolam 0.5 MG TABS: 0.5 | 20 days supply | Qty: 20 | Fill #0

## 2020-11-03 ENCOUNTER — Other Ambulatory Visit (HOSPITAL_COMMUNITY): Payer: Self-pay | Admitting: Dermatology

## 2020-11-03 MED FILL — SPIRONOLACTONE 50 MG TABLET: 50 | 30 days supply | Qty: 60 | Fill #0

## 2020-11-06 MED FILL — ALPRAZolam 0.5 MG TABS: 0.5 | 20 days supply | Qty: 20 | Fill #1

## 2020-11-09 MED FILL — buPROPion HCL ER (XL) 300 M: 300 | 30 days supply | Qty: 30 | Fill #2

## 2020-11-09 MED FILL — NORETHIND-ETH ESTRAD 1-0.02: 1-20 | 21 days supply | Qty: 21 | Fill #0

## 2020-11-11 ENCOUNTER — Other Ambulatory Visit (HOSPITAL_COMMUNITY): Payer: Self-pay | Admitting: Obstetrics and Gynecology

## 2020-11-11 DIAGNOSIS — Z113 Encounter for screening for infections with a predominantly sexual mode of transmission: Secondary | ICD-10-CM | POA: Diagnosis not present

## 2020-11-11 DIAGNOSIS — Z130 Encounter for screening for diseases of the blood and blood-forming organs and certain disorders involving the immune mechanism: Secondary | ICD-10-CM | POA: Diagnosis not present

## 2020-11-11 DIAGNOSIS — Z124 Encounter for screening for malignant neoplasm of cervix: Secondary | ICD-10-CM | POA: Diagnosis not present

## 2020-11-11 DIAGNOSIS — Z01419 Encounter for gynecological examination (general) (routine) without abnormal findings: Secondary | ICD-10-CM | POA: Diagnosis not present

## 2020-11-11 DIAGNOSIS — E669 Obesity, unspecified: Secondary | ICD-10-CM | POA: Diagnosis not present

## 2020-11-11 MED FILL — LO LOESTRIN FE 1-10 TABLET: 1 MG-10 MCG | 28 days supply | Qty: 28 | Fill #0

## 2020-11-12 DIAGNOSIS — Z1151 Encounter for screening for human papillomavirus (HPV): Secondary | ICD-10-CM | POA: Diagnosis not present

## 2020-11-12 DIAGNOSIS — Z124 Encounter for screening for malignant neoplasm of cervix: Secondary | ICD-10-CM | POA: Diagnosis not present

## 2020-11-12 MED FILL — DESVENLAFAXINE SUC ER 100 M: 100 | 90 days supply | Qty: 90 | Fill #1

## 2020-11-16 ENCOUNTER — Other Ambulatory Visit (HOSPITAL_COMMUNITY): Payer: Self-pay | Admitting: Psychiatry

## 2020-11-16 DIAGNOSIS — F332 Major depressive disorder, recurrent severe without psychotic features: Secondary | ICD-10-CM | POA: Diagnosis not present

## 2020-11-22 DIAGNOSIS — Z1152 Encounter for screening for COVID-19: Secondary | ICD-10-CM | POA: Diagnosis not present

## 2020-11-26 MED FILL — ALPRAZolam 0.5 MG TABS: 0.5 | 20 days supply | Qty: 20 | Fill #2

## 2020-11-27 MED FILL — SPIRONOLACTONE 50 MG TABLET: 50 | 30 days supply | Qty: 60 | Fill #1

## 2020-12-03 MED FILL — LO LOESTRIN FE 1-10 TABLET: 1 MG-10 MCG | 28 days supply | Qty: 28 | Fill #1

## 2020-12-16 MED FILL — buPROPion HCL ER (XL) 300 M: 300 | 90 days supply | Qty: 90 | Fill #0

## 2020-12-27 MED FILL — SPIRONOLACTONE 50 MG TABLET: 50 | 30 days supply | Qty: 60 | Fill #2

## 2020-12-31 MED FILL — LO LOESTRIN FE 1-10 TABLET: 1 MG-10 MCG | 28 days supply | Qty: 28 | Fill #2

## 2021-01-11 DIAGNOSIS — F332 Major depressive disorder, recurrent severe without psychotic features: Secondary | ICD-10-CM | POA: Diagnosis not present

## 2021-01-11 MED FILL — DESVENLAFAXINE SUC ER 50 MG: 50 | 7 days supply | Qty: 7 | Fill #0

## 2021-01-11 MED FILL — TRINTELLIX 10 MG TABLET: 10 | 30 days supply | Qty: 30 | Fill #0

## 2021-01-11 MED FILL — DESVENLAFAXINE SUCCINATE ER: 25 | 14 days supply | Qty: 14 | Fill #0

## 2021-01-16 MED FILL — DESVENLAFAXINE SUC ER 50 MG: 50 | 30 days supply | Qty: 30 | Fill #1

## 2021-01-19 ENCOUNTER — Other Ambulatory Visit (HOSPITAL_COMMUNITY): Payer: Self-pay | Admitting: Psychiatry

## 2021-01-19 MED FILL — ALPRAZolam 0.5 MG TABS: 0.5 | 20 days supply | Qty: 20 | Fill #0

## 2021-01-20 ENCOUNTER — Other Ambulatory Visit (HOSPITAL_COMMUNITY): Payer: Self-pay | Admitting: Obstetrics and Gynecology

## 2021-01-20 MED FILL — LEVONOR-ETH ESTRAD 0.1-0.02: 0.1-20 | 84 days supply | Qty: 84 | Fill #0

## 2021-01-22 ENCOUNTER — Other Ambulatory Visit (HOSPITAL_COMMUNITY): Payer: Self-pay | Admitting: Psychiatry

## 2021-01-22 MED FILL — LAMOTRIGINE 25 MG TABS: 25 | 30 days supply | Qty: 46 | Fill #0

## 2021-02-01 ENCOUNTER — Other Ambulatory Visit (HOSPITAL_COMMUNITY): Payer: Self-pay | Admitting: Obstetrics and Gynecology

## 2021-02-01 MED FILL — NORETHIND-ETH ESTRAD 1-0.02: 1-20 | 84 days supply | Qty: 63 | Fill #0

## 2021-02-02 ENCOUNTER — Other Ambulatory Visit (HOSPITAL_BASED_OUTPATIENT_CLINIC_OR_DEPARTMENT_OTHER): Payer: Self-pay

## 2021-02-05 DIAGNOSIS — F332 Major depressive disorder, recurrent severe without psychotic features: Secondary | ICD-10-CM | POA: Diagnosis not present

## 2021-02-06 MED FILL — ALPRAZolam 0.5 MG TABS: 0.5 | 20 days supply | Qty: 20 | Fill #1

## 2021-03-04 ENCOUNTER — Other Ambulatory Visit (HOSPITAL_COMMUNITY): Payer: Self-pay

## 2021-03-04 DIAGNOSIS — F332 Major depressive disorder, recurrent severe without psychotic features: Secondary | ICD-10-CM | POA: Diagnosis not present

## 2021-03-04 MED ORDER — LAMOTRIGINE 100 MG PO TABS
100.0000 mg | ORAL_TABLET | Freq: Every day | ORAL | 0 refills | Status: DC
  Filled 2021-03-04: qty 90, 90d supply, fill #0

## 2021-03-08 ENCOUNTER — Other Ambulatory Visit (HOSPITAL_COMMUNITY): Payer: Self-pay

## 2021-03-08 MED FILL — Bupropion HCl Tab ER 24HR 300 MG: ORAL | 90 days supply | Qty: 90 | Fill #0 | Status: AC

## 2021-03-10 ENCOUNTER — Other Ambulatory Visit (HOSPITAL_COMMUNITY): Payer: Self-pay

## 2021-03-15 ENCOUNTER — Other Ambulatory Visit (HOSPITAL_COMMUNITY): Payer: Self-pay

## 2021-03-15 MED ORDER — DESVENLAFAXINE SUCCINATE ER 25 MG PO TB24
1.0000 | ORAL_TABLET | Freq: Every day | ORAL | 0 refills | Status: DC
  Filled 2021-03-15: qty 90, 90d supply, fill #0

## 2021-03-16 ENCOUNTER — Other Ambulatory Visit (HOSPITAL_COMMUNITY): Payer: Self-pay

## 2021-03-17 ENCOUNTER — Other Ambulatory Visit (HOSPITAL_COMMUNITY): Payer: Self-pay

## 2021-03-23 ENCOUNTER — Encounter: Payer: 59 | Admitting: Family Medicine

## 2021-03-26 ENCOUNTER — Other Ambulatory Visit: Payer: Self-pay

## 2021-03-26 ENCOUNTER — Encounter: Payer: Self-pay | Admitting: Family Medicine

## 2021-03-26 ENCOUNTER — Ambulatory Visit (INDEPENDENT_AMBULATORY_CARE_PROVIDER_SITE_OTHER): Payer: BC Managed Care – PPO | Admitting: Family Medicine

## 2021-03-26 VITALS — BP 128/72 | HR 87 | Temp 97.3°F | Ht 65.5 in | Wt 143.4 lb

## 2021-03-26 DIAGNOSIS — F418 Other specified anxiety disorders: Secondary | ICD-10-CM | POA: Diagnosis not present

## 2021-03-26 DIAGNOSIS — Z0000 Encounter for general adult medical examination without abnormal findings: Secondary | ICD-10-CM | POA: Diagnosis not present

## 2021-03-26 DIAGNOSIS — Z111 Encounter for screening for respiratory tuberculosis: Secondary | ICD-10-CM | POA: Diagnosis not present

## 2021-03-26 LAB — CBC WITH DIFFERENTIAL/PLATELET
Basophils Absolute: 0 10*3/uL (ref 0.0–0.1)
Basophils Relative: 0.6 % (ref 0.0–3.0)
Eosinophils Absolute: 0.1 10*3/uL (ref 0.0–0.7)
Eosinophils Relative: 1.4 % (ref 0.0–5.0)
HCT: 38.3 % (ref 36.0–46.0)
Hemoglobin: 13.1 g/dL (ref 12.0–15.0)
Lymphocytes Relative: 34.8 % (ref 12.0–46.0)
Lymphs Abs: 1.5 10*3/uL (ref 0.7–4.0)
MCHC: 34.1 g/dL (ref 30.0–36.0)
MCV: 88.2 fl (ref 78.0–100.0)
Monocytes Absolute: 0.3 10*3/uL (ref 0.1–1.0)
Monocytes Relative: 7.7 % (ref 3.0–12.0)
Neutro Abs: 2.4 10*3/uL (ref 1.4–7.7)
Neutrophils Relative %: 55.5 % (ref 43.0–77.0)
Platelets: 208 10*3/uL (ref 150.0–400.0)
RBC: 4.35 Mil/uL (ref 3.87–5.11)
RDW: 12.7 % (ref 11.5–15.5)
WBC: 4.3 10*3/uL (ref 4.0–10.5)

## 2021-03-26 LAB — LIPID PANEL
Cholesterol: 202 mg/dL — ABNORMAL HIGH (ref 0–200)
HDL: 73.3 mg/dL (ref >39.00)
LDL Cholesterol: 105 mg/dL — ABNORMAL HIGH (ref 0–99)
NonHDL: 129.15
Total CHOL/HDL Ratio: 3
Triglycerides: 123 mg/dL (ref 0.0–149.0)
VLDL: 24.6 mg/dL (ref 0.0–40.0)

## 2021-03-26 LAB — COMPREHENSIVE METABOLIC PANEL
ALT: 12 U/L (ref 0–35)
AST: 14 U/L (ref 0–37)
Albumin: 4.4 g/dL (ref 3.5–5.2)
Alkaline Phosphatase: 46 U/L (ref 39–117)
BUN: 20 mg/dL (ref 6–23)
CO2: 26 mEq/L (ref 19–32)
Calcium: 9 mg/dL (ref 8.4–10.5)
Chloride: 106 mEq/L (ref 96–112)
Creatinine, Ser: 0.9 mg/dL (ref 0.40–1.20)
GFR: 91.06 mL/min (ref >60.00)
Glucose, Bld: 75 mg/dL (ref 70–99)
Potassium: 4.2 mEq/L (ref 3.5–5.1)
Sodium: 141 mEq/L (ref 135–145)
Total Bilirubin: 0.9 mg/dL (ref 0.2–1.2)
Total Protein: 6.4 g/dL (ref 6.0–8.3)

## 2021-03-26 LAB — TSH: TSH: 1.3 u[IU]/mL (ref 0.35–4.50)

## 2021-03-26 NOTE — Progress Notes (Signed)
Subjective:    Patient ID: Rachel [name], female    DOB: 2001-10-10, 20 y.o.   MRN: [id number]  This visit occurred during the SARS-CoV-2 public health emergency.  Safety protocols were in place, including screening questions prior to the visit, additional usage of staff PPE, and extensive cleaning of exam room while observing appropriate contact time as indicated for disinfecting solutions.    HPI Here for health maintenance exam and to review chronic medical problems    Wt Readings from Last 3 Encounters:  03/26/21 143 lb 7 oz (65.1 kg)  12/20/19 139 lb 12.8 oz (63.4 kg)  06/25/18 150 lb (68 kg) (81 %, Z= 0.89)*   * Growth percentiles are based on CDC (Girls, 2-20 Years) data.   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 august   Has had covid shots (J and J with booster) Had Tdap 4/20 -up to date   She sees gyn and had laparoscopy for ovarian cyst  Self breast exams-no lumps No more problems   takign alese 0.1-20 OC  Not a lot of libidio    She sees psychiatry Ruta Hinds) for depression Fiddling with meds recently and a lot of stess (finals and graduation)   Still plays some tennis/will keep up with it  Diet is pretty healthy   Takes spironolactone for acne -works fairly well   BP Readings from Last 3 Encounters:  03/26/21 128/72  12/20/19 105/69  06/25/18 118/84   Pulse Readings from Last 3 Encounters:  03/26/21 87  12/20/19 75  06/25/18 82    Patient Active Problem List   Diagnosis Date Noted  . Routine general medical examination at a health care facility 03/26/2021  . Screening-pulmonary TB 03/26/2021  . Lesion of spleen 06/05/2019  . Encounter for sickle-cell screening 06/05/2017  . Stress reaction 01/19/2016  . Well adolescent visit 10/26/2012  . Anxiety associated with depression 08/02/2011   Past Medical History:  Diagnosis Date  . Anemia   . Anxiety   . Depression   .  Hydrosalpinx    Past Surgical History:  Procedure Laterality Date  . LABIOPLASTY N/A 12/20/2019   Procedure: LABIAPLASTY;  Surgeon: Janyth Contes, MD;  Location: [facility];  Service: Gynecology;  Laterality: N/A;  . LAPAROSCOPY N/A 12/20/2019   Procedure: LAPAROSCOPY OPERATIVE;  Surgeon: Janyth Contes, MD;  Location: [place];  Service: Gynecology;  Laterality: N/A;  request RNFA Kassie RNFA confirmed on 11/22/19 CS  . TONSILLECTOMY AND ADENOIDECTOMY  2006  . UNILATERAL SALPINGECTOMY Left 12/20/2019   Procedure: LAPAROSCOPIC UNILATERAL SALPINGECTOMY;  Surgeon: Janyth Contes, MD;  Location: [place];  Service: Gynecology;  Laterality: Left;   Social History   Tobacco Use  . Smoking status: Never Smoker  . Smokeless tobacco: Never Used  Vaping Use  . Vaping Use: Never used  Substance Use Topics  . Alcohol use: Yes    Alcohol/week: 0.0 standard drinks    Comment: occ  . Drug use: No   Family History  Problem Relation Age of Onset  . Anxiety disorder Mother   . Anxiety disorder Father   . Cancer Maternal Grandmother        breast CA  . Hyperlipidemia Maternal Grandmother   . Hypertension Maternal Grandfather   . Diabetes Paternal Grandmother   . Hypertension Paternal Grandfather    No Known Allergies Current Outpatient Medications on File Prior to  Visit  Medication Sig Dispense Refill  . ALPRAZolam (XANAX) 0.5 MG tablet TAKE 1 TABLET DAILY AS NEEDED. 20 tablet 2  . buPROPion (WELLBUTRIN XL) 300 MG 24 hr tablet TAKE 1 TABLET BY MOUTH EVERY MORNING 90 tablet 1  . Desvenlafaxine Succinate ER 25 MG TB24 Take 1 tablet by mouth daily 90 tablet 0  . lamoTRIgine (LAMICTAL) 25 MG tablet TAKE ONE TABLET BY MOUTH ONCE DAILY X 14 DAYS, THEN TAKE 2 TABLETS BY MOUTH ONCE DAILY 60 tablet 1  . levonorgestrel-ethinyl estradiol (ALESSE) 0.1-20 MG-MCG tablet TAKE 1 TABLET BY MOUTH ONCE A DAY 84 tablet 4  . spironolactone  (ALDACTONE) 50 MG tablet TAKE 1 TABLET BY MOUTH TWO TIMES DAILY 60 tablet 3   No current facility-administered medications on file prior to visit.    Review of Systems  Constitutional: Negative for activity change, appetite change, fatigue, fever and unexpected weight change.  HENT: Negative for congestion, ear pain, rhinorrhea, sinus pressure and sore throat.   Eyes: Negative for pain, redness and visual disturbance.  Respiratory: Negative for cough, shortness of breath and wheezing.   Cardiovascular: Negative for chest pain and palpitations.  Gastrointestinal: Negative for abdominal pain, blood in stool, constipation and diarrhea.  Endocrine: Negative for polydipsia and polyuria.  Genitourinary: Negative for dysuria, frequency and urgency.  Musculoskeletal: Negative for arthralgias, back pain and myalgias.  Skin: Negative for pallor and rash.  Allergic/Immunologic: Negative for environmental allergies.  Neurological: Negative for dizziness, syncope and headaches.  Hematological: Negative for adenopathy. Does not bruise/bleed easily.  Psychiatric/Behavioral: Negative for decreased concentration and dysphoric mood. The patient is not nervous/anxious.        Stressors noted       Objective:   Physical Exam Constitutional:      General: She is not in acute distress.    Appearance: Normal appearance. She is well-developed and normal weight. She is not ill-appearing or diaphoretic.  HENT:     Head: Normocephalic and atraumatic.     Right Ear: Tympanic membrane, ear canal and external ear normal.     Left Ear: Tympanic membrane, ear canal and external ear normal.     Nose: Nose normal. No congestion.     Mouth/Throat:     Mouth: Mucous membranes are moist.     Pharynx: Oropharynx is clear. No posterior oropharyngeal erythema.  Eyes:     General: No scleral icterus.    Extraocular Movements: Extraocular movements intact.     Conjunctiva/sclera: Conjunctivae normal.     Pupils:  Pupils are equal, round, and reactive to light.  Neck:     Thyroid: No thyromegaly.     Vascular: No carotid bruit or JVD.  Cardiovascular:     Rate and Rhythm: Normal rate and regular rhythm.     Pulses: Normal pulses.     Heart sounds: Normal heart sounds. No gallop.   Pulmonary:     Effort: Pulmonary effort is normal. No respiratory distress.     Breath sounds: Normal breath sounds. No wheezing.     Comments: Good air exch Chest:     Chest wall: No tenderness.  Abdominal:     General: Bowel sounds are normal. There is no distension or abdominal bruit.     Palpations: Abdomen is soft. There is no mass.     Tenderness: There is no abdominal tenderness.     Hernia: No hernia is present.  Genitourinary:    Comments: Gyn provider does breast and pelvic exam Musculoskeletal:  General: No tenderness. Normal range of motion.     Cervical back: Normal range of motion and neck supple. No rigidity. No muscular tenderness.     Right lower leg: No edema.     Left lower leg: No edema.  Lymphadenopathy:     Cervical: No cervical adenopathy.  Skin:    General: Skin is warm and dry.     Coloration: Skin is not pale.     Findings: No erythema or rash.     Comments: Solar lentigines diffusely   Neurological:     Mental Status: She is alert. Mental status is at baseline.     Cranial Nerves: No cranial nerve deficit.     Motor: No abnormal muscle tone.     Coordination: Coordination normal.     Gait: Gait normal.     Deep Tendon Reflexes: Reflexes are normal and symmetric.  Psychiatric:        Mood and Affect: Mood normal.        Cognition and Memory: Cognition and memory normal.           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

## 2021-03-26 NOTE — Patient Instructions (Signed)
Take care of yourself   Labs today  I will finish your school paperwork when I get the TB result

## 2021-03-27 NOTE — Assessment & Plan Note (Signed)
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

## 2021-03-27 NOTE — Assessment & Plan Note (Signed)
Continues psychiatric care

## 2021-03-27 NOTE — Assessment & Plan Note (Signed)
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

## 2021-03-28 LAB — QUANTIFERON-TB GOLD PLUS
Mitogen-NIL: >10 IU/mL
NIL: 0.02 IU/mL
QuantiFERON-TB Gold Plus: NEGATIVE
TB1-NIL: 0 IU/mL
TB2-NIL: 0 IU/mL

## 2021-04-01 ENCOUNTER — Other Ambulatory Visit (HOSPITAL_COMMUNITY): Payer: Self-pay

## 2021-04-01 DIAGNOSIS — F332 Major depressive disorder, recurrent severe without psychotic features: Secondary | ICD-10-CM | POA: Diagnosis not present

## 2021-04-01 MED ORDER — DESVENLAFAXINE SUCCINATE ER 50 MG PO TB24
50.0000 mg | ORAL_TABLET | Freq: Every day | ORAL | 0 refills | Status: DC
  Filled 2021-04-01: qty 14, 14d supply, fill #0

## 2021-04-01 MED ORDER — DESVENLAFAXINE SUCCINATE ER 100 MG PO TB24
ORAL_TABLET | ORAL | 1 refills | Status: DC
  Filled 2021-04-01: qty 90, 90d supply, fill #0
  Filled 2021-07-20: qty 90, 90d supply, fill #1

## 2021-04-01 MED ORDER — ALPRAZOLAM 0.5 MG PO TABS
0.5000 mg | ORAL_TABLET | Freq: Every day | ORAL | 2 refills | Status: DC | PRN
  Filled 2021-04-01: qty 20, 20d supply, fill #0

## 2021-04-02 ENCOUNTER — Other Ambulatory Visit (HOSPITAL_COMMUNITY): Payer: Self-pay

## 2021-04-05 DIAGNOSIS — F332 Major depressive disorder, recurrent severe without psychotic features: Secondary | ICD-10-CM | POA: Diagnosis not present

## 2021-04-28 DIAGNOSIS — F332 Major depressive disorder, recurrent severe without psychotic features: Secondary | ICD-10-CM | POA: Diagnosis not present

## 2021-05-11 DIAGNOSIS — F332 Major depressive disorder, recurrent severe without psychotic features: Secondary | ICD-10-CM | POA: Diagnosis not present

## 2021-06-15 DIAGNOSIS — F411 Generalized anxiety disorder: Secondary | ICD-10-CM | POA: Diagnosis not present

## 2021-06-15 DIAGNOSIS — F4323 Adjustment disorder with mixed anxiety and depressed mood: Secondary | ICD-10-CM | POA: Diagnosis not present

## 2021-06-21 ENCOUNTER — Other Ambulatory Visit: Payer: Self-pay

## 2021-06-21 ENCOUNTER — Other Ambulatory Visit (HOSPITAL_COMMUNITY): Payer: Self-pay

## 2021-06-22 ENCOUNTER — Other Ambulatory Visit (HOSPITAL_COMMUNITY): Payer: Self-pay

## 2021-06-23 ENCOUNTER — Other Ambulatory Visit (HOSPITAL_COMMUNITY): Payer: Self-pay

## 2021-06-23 ENCOUNTER — Other Ambulatory Visit: Payer: Self-pay

## 2021-06-23 DIAGNOSIS — F330 Major depressive disorder, recurrent, mild: Secondary | ICD-10-CM | POA: Diagnosis not present

## 2021-06-23 DIAGNOSIS — F4323 Adjustment disorder with mixed anxiety and depressed mood: Secondary | ICD-10-CM | POA: Diagnosis not present

## 2021-06-23 DIAGNOSIS — F411 Generalized anxiety disorder: Secondary | ICD-10-CM | POA: Diagnosis not present

## 2021-06-23 MED ORDER — BUPROPION HCL ER (XL) 300 MG PO TB24
ORAL_TABLET | ORAL | 1 refills | Status: DC
  Filled 2021-06-23: qty 90, 90d supply, fill #0
  Filled 2021-09-21: qty 90, 90d supply, fill #1

## 2021-07-20 ENCOUNTER — Other Ambulatory Visit (HOSPITAL_COMMUNITY): Payer: Self-pay

## 2021-07-20 MED FILL — Levonorgestrel & Ethinyl Estradiol Tab 0.1 MG-20 MCG: ORAL | 84 days supply | Qty: 84 | Fill #0 | Status: AC

## 2021-07-22 ENCOUNTER — Other Ambulatory Visit (HOSPITAL_COMMUNITY): Payer: Self-pay

## 2021-08-05 ENCOUNTER — Other Ambulatory Visit (HOSPITAL_COMMUNITY): Payer: Self-pay

## 2021-09-21 ENCOUNTER — Other Ambulatory Visit (HOSPITAL_COMMUNITY): Payer: Self-pay

## 2021-10-21 ENCOUNTER — Other Ambulatory Visit (HOSPITAL_COMMUNITY): Payer: Self-pay

## 2021-10-21 MED FILL — Levonorgestrel & Ethinyl Estradiol Tab 0.1 MG-20 MCG: ORAL | 84 days supply | Qty: 84 | Fill #1 | Status: AC

## 2021-10-22 ENCOUNTER — Other Ambulatory Visit (HOSPITAL_COMMUNITY): Payer: Self-pay

## 2021-10-22 MED ORDER — DESVENLAFAXINE SUCCINATE ER 100 MG PO TB24
ORAL_TABLET | ORAL | 1 refills | Status: DC
  Filled 2021-10-22: qty 90, 90d supply, fill #0
  Filled 2022-01-27: qty 90, 90d supply, fill #1

## 2021-11-11 ENCOUNTER — Other Ambulatory Visit (HOSPITAL_COMMUNITY): Payer: Self-pay

## 2021-11-11 DIAGNOSIS — F330 Major depressive disorder, recurrent, mild: Secondary | ICD-10-CM | POA: Diagnosis not present

## 2021-11-11 DIAGNOSIS — F411 Generalized anxiety disorder: Secondary | ICD-10-CM | POA: Diagnosis not present

## 2021-11-11 DIAGNOSIS — F4323 Adjustment disorder with mixed anxiety and depressed mood: Secondary | ICD-10-CM | POA: Diagnosis not present

## 2021-11-11 MED ORDER — ALPRAZOLAM 0.5 MG PO TABS
0.5000 mg | ORAL_TABLET | Freq: Every day | ORAL | 0 refills | Status: DC
  Filled 2021-11-11: qty 30, 30d supply, fill #0

## 2021-11-11 MED ORDER — BUPROPION HCL ER (XL) 300 MG PO TB24
300.0000 mg | ORAL_TABLET | Freq: Every morning | ORAL | 1 refills | Status: DC
  Filled 2021-11-11 – 2021-12-21 (×2): qty 90, 90d supply, fill #0

## 2021-11-18 ENCOUNTER — Other Ambulatory Visit (HOSPITAL_COMMUNITY): Payer: Self-pay

## 2021-11-18 MED ORDER — NORETHINDRONE ACET-ETHINYL EST 1-20 MG-MCG PO TABS
1.0000 | ORAL_TABLET | Freq: Every day | ORAL | 5 refills | Status: DC
  Filled 2021-11-18: qty 63, 63d supply, fill #0
  Filled 2022-01-27: qty 63, 63d supply, fill #1
  Filled 2022-04-20: qty 63, 63d supply, fill #2
  Filled 2022-07-08: qty 63, 63d supply, fill #3
  Filled 2022-09-19: qty 63, 63d supply, fill #4
  Filled 2022-11-08: qty 63, 63d supply, fill #5

## 2021-12-09 ENCOUNTER — Other Ambulatory Visit (HOSPITAL_COMMUNITY): Payer: Self-pay

## 2021-12-09 MED ORDER — VYVANSE 30 MG PO CAPS
ORAL_CAPSULE | ORAL | 0 refills | Status: DC
  Filled 2021-12-09: qty 30, 30d supply, fill #0

## 2021-12-13 ENCOUNTER — Other Ambulatory Visit (HOSPITAL_COMMUNITY): Payer: Self-pay

## 2021-12-21 ENCOUNTER — Other Ambulatory Visit (HOSPITAL_COMMUNITY): Payer: Self-pay

## 2022-01-05 ENCOUNTER — Other Ambulatory Visit (HOSPITAL_COMMUNITY): Payer: Self-pay

## 2022-01-05 MED ORDER — VYVANSE 30 MG PO CAPS
ORAL_CAPSULE | ORAL | 0 refills | Status: DC
  Filled 2022-01-05: qty 30, 30d supply, fill #0
  Filled 2022-01-19: qty 90, 90d supply, fill #0

## 2022-01-10 ENCOUNTER — Other Ambulatory Visit (HOSPITAL_COMMUNITY): Payer: Self-pay

## 2022-01-19 ENCOUNTER — Other Ambulatory Visit (HOSPITAL_COMMUNITY): Payer: Self-pay

## 2022-01-27 ENCOUNTER — Other Ambulatory Visit (HOSPITAL_COMMUNITY): Payer: Self-pay

## 2022-03-21 ENCOUNTER — Other Ambulatory Visit (HOSPITAL_COMMUNITY): Payer: Self-pay

## 2022-03-21 MED ORDER — TRETINOIN 0.025 % EX CREA
TOPICAL_CREAM | CUTANEOUS | 1 refills | Status: AC
  Filled 2022-03-21: qty 45, 30d supply, fill #0

## 2022-03-22 ENCOUNTER — Other Ambulatory Visit (HOSPITAL_COMMUNITY): Payer: Self-pay

## 2022-03-24 ENCOUNTER — Other Ambulatory Visit (HOSPITAL_COMMUNITY): Payer: Self-pay

## 2022-03-24 MED ORDER — DESVENLAFAXINE SUCCINATE ER 100 MG PO TB24
ORAL_TABLET | ORAL | 1 refills | Status: DC
  Filled 2022-03-24 – 2022-04-27 (×2): qty 90, 90d supply, fill #0
  Filled 2022-11-08: qty 90, 90d supply, fill #1

## 2022-03-24 MED ORDER — VYVANSE 50 MG PO CAPS
ORAL_CAPSULE | ORAL | 0 refills | Status: DC
Start: 2022-03-24 — End: 2022-06-29
  Filled 2022-03-24: qty 30, 30d supply, fill #0

## 2022-03-24 MED ORDER — ALPRAZOLAM 0.5 MG PO TABS
ORAL_TABLET | ORAL | 0 refills | Status: DC
  Filled 2022-03-24: qty 30, 30d supply, fill #0

## 2022-03-24 MED ORDER — BUPROPION HCL ER (XL) 300 MG PO TB24
ORAL_TABLET | ORAL | 1 refills | Status: DC
  Filled 2022-03-24: qty 90, 90d supply, fill #0
  Filled 2022-06-28: qty 90, 90d supply, fill #1

## 2022-03-24 MED ORDER — VYVANSE 50 MG PO CAPS
ORAL_CAPSULE | ORAL | 0 refills | Status: DC
  Filled 2022-04-27: qty 30, 30d supply, fill #0

## 2022-03-25 ENCOUNTER — Other Ambulatory Visit (HOSPITAL_COMMUNITY): Payer: Self-pay

## 2022-04-20 ENCOUNTER — Other Ambulatory Visit (HOSPITAL_COMMUNITY): Payer: Self-pay

## 2022-04-27 ENCOUNTER — Other Ambulatory Visit (HOSPITAL_COMMUNITY): Payer: Self-pay

## 2022-05-23 ENCOUNTER — Other Ambulatory Visit (HOSPITAL_COMMUNITY): Payer: Self-pay

## 2022-06-06 ENCOUNTER — Other Ambulatory Visit (HOSPITAL_COMMUNITY): Payer: Self-pay

## 2022-06-06 MED ORDER — VYVANSE 50 MG PO CAPS
ORAL_CAPSULE | ORAL | 0 refills | Status: DC
  Filled 2022-06-06: qty 30, 30d supply, fill #0

## 2022-06-06 MED ORDER — VYVANSE 50 MG PO CAPS
ORAL_CAPSULE | ORAL | 0 refills | Status: DC
  Filled 2022-07-20: qty 30, 30d supply, fill #0

## 2022-06-06 MED ORDER — VYVANSE 50 MG PO CAPS
ORAL_CAPSULE | ORAL | 0 refills | Status: DC
  Filled 2022-08-22: qty 30, 30d supply, fill #0

## 2022-06-09 ENCOUNTER — Other Ambulatory Visit (HOSPITAL_COMMUNITY): Payer: Self-pay

## 2022-06-10 ENCOUNTER — Other Ambulatory Visit (HOSPITAL_COMMUNITY): Payer: Self-pay

## 2022-06-20 ENCOUNTER — Telehealth: Payer: Self-pay

## 2022-06-20 NOTE — Telephone Encounter (Signed)
[place] Night - Client Nonclinical Telephone Record  [facility] Client [place] Night - Client Client Site [place] Provider Loura Pardon - MD Contact Type Call Who Is Calling Patient / Member / Family / Caregiver Caller Name Taneisha Fuson Caller Phone Number [phone] Patient Name Rachel [name] Patient DOB 2001-10-17 Call Type Message Only Information Provided Reason for Call Request for General Office Information Initial Comment Caller states she needs copy of TB test from last year. Additional Comment htrs prov Disp. Time Disposition Final User 06/18/2022 3:49:08 PM General Information Provided Yes Idolina Primer Call Closed By: Idolina Primer Transaction Date/Time: 06/18/2022 3:46:54 PM (ET

## 2022-06-27 ENCOUNTER — Encounter: Payer: Self-pay | Admitting: Family Medicine

## 2022-06-27 ENCOUNTER — Other Ambulatory Visit (HOSPITAL_COMMUNITY): Payer: Self-pay

## 2022-06-27 ENCOUNTER — Telehealth: Payer: 59 | Admitting: Family Medicine

## 2022-06-27 DIAGNOSIS — R0602 Shortness of breath: Secondary | ICD-10-CM

## 2022-06-27 MED ORDER — ALBUTEROL SULFATE HFA 108 (90 BASE) MCG/ACT IN AERS
2.0000 | INHALATION_SPRAY | Freq: Four times a day (QID) | RESPIRATORY_TRACT | 0 refills | Status: DC | PRN
  Filled 2022-06-27: qty 6.7, 25d supply, fill #0

## 2022-06-27 NOTE — Progress Notes (Signed)
Visit for Asthma  Based on what you have shared with me, it looks like you may have a flare up of your asthma.  Asthma is a chronic (ongoing) lung disease which results in airway obstruction, inflammation and hyper-responsiveness.   Asthma symptoms vary from person to person, with common symptoms including nighttime awakening and decreased ability to participate in normal activities as a result of shortness of breath. It is often triggered by changes in weather, changes in the season, changes in air temperature, or inside (home, school, daycare or work) allergens such as animal dander, mold, mildew, woodstoves or cockroaches.   It can also be triggered by hormonal changes, extreme emotion, physical exertion or an upper respiratory tract illness.     It is important to identify the trigger, and then eliminate or avoid the trigger if possible.   If you have been prescribed medications to be taken on a regular basis, it is important to follow the asthma action plan and to follow guidelines to adjust medication in response to increasing symptoms of decreased peak expiratory flow rate  Treatment: I have prescribed: Albuterol (Proventil HFA; Ventolin HFA) 108 (90 Base) MCG/ACT Inhaler 2 puffs into the lungs every six hours as needed for wheezing or shortness of breath  HOME CARE Only take medications as instructed by your medical team. Consider wearing a mask or scarf to improve breathing air temperature have been shown to decrease irritation and decrease exacerbations Get rest. Taking a steamy shower or using a humidifier may help nasal congestion sand ease sore throat pain. You can place a towel over your head and breathe in the steam from hot water coming from a faucet. Using a saline nasal spray works much the same way.  Cough drops, hare candies and sore throat lozenges may ease your  cough.  Avoid close contacts especially the very you and the elderly Cover your mouth if you cough or sneeze Always remember to wash your hands.    GET HELP RIGHT AWAY IF: You develop worsening symptoms; breathlessness at rest, drowsy, confused or agitated, unable to speak in full sentences You have coughing fits You develop a severe headache or visual changes You develop shortness of breath, difficulty breathing or start having chest pain Your symptoms persist after you have completed your treatment plan If your symptoms do not improve within 10 days  MAKE SURE YOU Understand these instructions. Will watch your condition. Will get help right away if you are not doing well or get worse.   Your e-visit answers were reviewed by a board certified advanced clinical practitioner to complete your personal care plan, Depending upon the condition, your plan could have included both over the counter or prescription medications.   Please review your pharmacy choice. Your safety is important to [place]. If you have drug allergies check your prescription carefully.  You can use MyChart to ask questions about today's visit, request a non-urgent  call back, or ask for a work or school excuse for 24 hours related to this e-Visit. If it has been greater than 24 hours you will need to follow up with your provider, or enter a new e-Visit to address those concerns.   You will get an e-mail in the next two days asking about your experience. I hope that your e-visit has been valuable and will speed your recovery. Thank you for using e-visits.  I provided 5 minutes of non face-to-face time during this encounter for chart review, medication and order placement, as well  as and documentation.

## 2022-06-28 ENCOUNTER — Other Ambulatory Visit (HOSPITAL_COMMUNITY): Payer: Self-pay

## 2022-06-29 ENCOUNTER — Encounter: Payer: Self-pay | Admitting: Family Medicine

## 2022-06-29 ENCOUNTER — Ambulatory Visit (INDEPENDENT_AMBULATORY_CARE_PROVIDER_SITE_OTHER)
Admission: RE | Admit: 2022-06-29 | Discharge: 2022-06-29 | Disposition: A | Discharge status: Home / Self Care | Payer: 59 | Source: Ambulatory Visit | Attending: Family Medicine | Admitting: Family Medicine

## 2022-06-29 ENCOUNTER — Ambulatory Visit (INDEPENDENT_AMBULATORY_CARE_PROVIDER_SITE_OTHER): Payer: 59 | Admitting: Family Medicine

## 2022-06-29 VITALS — BP 110/70 | HR 97 | Ht 65.5 in | Wt 140.4 lb

## 2022-06-29 DIAGNOSIS — F418 Other specified anxiety disorders: Secondary | ICD-10-CM

## 2022-06-29 DIAGNOSIS — Z862 Personal history of diseases of the blood and blood-forming organs and certain disorders involving the immune mechanism: Secondary | ICD-10-CM

## 2022-06-29 DIAGNOSIS — R0602 Shortness of breath: Secondary | ICD-10-CM | POA: Insufficient documentation

## 2022-06-29 LAB — CBC WITH DIFFERENTIAL/PLATELET
Basophils Absolute: 0 10*3/uL (ref 0.0–0.1)
Basophils Relative: 0.7 % (ref 0.0–3.0)
Eosinophils Absolute: 0.1 10*3/uL (ref 0.0–0.7)
Eosinophils Relative: 1.1 % (ref 0.0–5.0)
HCT: 40.7 % (ref 36.0–46.0)
Hemoglobin: 13.4 g/dL (ref 12.0–15.0)
Lymphocytes Relative: 34.7 % (ref 12.0–46.0)
Lymphs Abs: 1.9 10*3/uL (ref 0.7–4.0)
MCHC: 32.9 g/dL (ref 30.0–36.0)
MCV: 89.8 fl (ref 78.0–100.0)
Monocytes Absolute: 0.4 10*3/uL (ref 0.1–1.0)
Monocytes Relative: 7.6 % (ref 3.0–12.0)
Neutro Abs: 3 10*3/uL (ref 1.4–7.7)
Neutrophils Relative %: 55.9 % (ref 43.0–77.0)
Platelets: 244 10*3/uL (ref 150.0–400.0)
RBC: 4.53 Mil/uL (ref 3.87–5.11)
RDW: 13.1 % (ref 11.5–15.5)
WBC: 5.4 10*3/uL (ref 4.0–10.5)

## 2022-06-29 LAB — FERRITIN: Ferritin: 35.8 ng/mL (ref 10.0–291.0)

## 2022-06-29 LAB — IRON: Iron: 164 ug/dL — ABNORMAL HIGH (ref 42–145)

## 2022-06-29 NOTE — Assessment & Plan Note (Signed)
Under care of psychiatry  More stress with change in school plan  vyvanse helps concentration however  Continues wellbutrin  Wonder if sob is an anxiety symptom?  Reviewed stressors/ coping techniques/symptoms/ support sources/ tx options and side effects in detail today

## 2022-06-29 NOTE — Assessment & Plan Note (Signed)
Pt reports difficulty getting a deep breath  No wheeze or stridor  Nl exam Good exercise tol  cxr today and anemia labs  Trial of albuterol mdi and report back  Disc poss of anxiety adding to this as well

## 2022-06-29 NOTE — Patient Instructions (Signed)
Take care of yourself  Keep good sleep habits Balanced diet and exercise   Chest xray today   Labs today for anemia   Try the inhaler when/if symptoms come back and let us know if it helps

## 2022-06-29 NOTE — Progress Notes (Signed)
Subjective:    Patient ID: Rachel [name], female    DOB: [birth year], 21 y.o.   MRN: [id number]  HPI Pt presents with breathing problem   Wt Readings from Last 3 Encounters:  06/29/22 140 lb 6.4 oz (63.7 kg)  03/26/21 143 lb 7 oz (65.1 kg)  12/20/19 139 lb 12.8 oz (63.4 kg)   23.01 kg/m  2 years off and on ? If asthma    Cannot take full breath  No trigger / is random   comes for a while and then stops   No problems with exercise   She works at [employer] Has to talk on microphone    No wheeze or cough  Occ tight feeling in the middle of chest Makes upper/middle back tight  Trying to take deep breaths   Not worse with stressed   No heartburn    Pulse ox 99%  BP Readings from Last 3 Encounters:  06/29/22 110/70  03/26/21 128/72  12/20/19 105/69   Pulse Readings from Last 3 Encounters:  06/29/22 97  03/26/21 87  12/20/19 75    Anxiety with depression  Sees psychiatry  Vyvanse Wellbutrin  Xanax  No lung problems as a child  Had e visit-sent in inhaler   Some stress  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   Anemia in past /long ago -but not recently  Lab Results  Component Value Date   WBC 4.3 03/26/2021   HGB 13.1 03/26/2021   HCT 38.3 03/26/2021   MCV 88.2 03/26/2021   PLT 208.0 03/26/2021   Needs a lot of sleep   Patient Active Problem List   Diagnosis Date Noted   Shortness of breath 06/29/2022   History of anemia 06/29/2022   Routine general medical examination at a health care facility 03/26/2021   Screening-pulmonary TB 03/26/2021   Lesion of spleen 06/05/2019   Encounter for sickle-cell screening 06/05/2017   Stress reaction 01/19/2016   Well adolescent visit 10/26/2012   Anxiety associated with depression 08/02/2011   Past Medical History:  Diagnosis Date   Anemia    Anxiety    Depression    Hydrosalpinx    Past Surgical History:  Procedure Laterality Date   LABIOPLASTY N/A 12/20/2019   Procedure:  LABIAPLASTY;  Surgeon: Janyth Contes, MD;  Location: [place];  Service: Gynecology;  Laterality: N/A;   LAPAROSCOPY N/A 12/20/2019   Procedure: LAPAROSCOPY OPERATIVE;  Surgeon: Janyth Contes, MD;  Location: [place];  Service: Gynecology;  Laterality: N/A;  request RNFA Kassie RNFA confirmed on 11/22/19 CS   TONSILLECTOMY AND ADENOIDECTOMY  2006   UNILATERAL SALPINGECTOMY Left 12/20/2019   Procedure: LAPAROSCOPIC UNILATERAL SALPINGECTOMY;  Surgeon: Janyth Contes, MD;  Location: [place];  Service: Gynecology;  Laterality: Left;   Social History   Tobacco Use   Smoking status: Never   Smokeless tobacco: Never  Vaping Use   Vaping Use: Never used  Substance Use Topics   Alcohol use: Yes    Alcohol/week: 0.0 standard drinks of alcohol    Comment: occ   Drug use: No   Family History  Problem Relation Age of Onset   Anxiety disorder Mother    Anxiety disorder Father    Cancer Maternal Grandmother        breast CA   Hyperlipidemia Maternal Grandmother    Hypertension Maternal Grandfather    Diabetes Paternal Grandmother    Hypertension Paternal Grandfather    No  Known Allergies Current Outpatient Medications on File Prior to Visit  Medication Sig Dispense Refill   ALPRAZolam (XANAX) 0.5 MG tablet TAKE 1 TABLET DAILY AS NEEDED. 20 tablet 2   buPROPion (WELLBUTRIN XL) 300 MG 24 hr tablet Take 1 tablet by mouth every morning. 90 tablet 1   desvenlafaxine (PRISTIQ) 100 MG 24 hr tablet Take 1 tablet by mouth once daily 90 tablet 1   [START ON 08/01/2022] lisdexamfetamine (VYVANSE) 50 MG capsule Take 1 capsule by mouth once daily (08/01/22) 30 capsule 0   [START ON 07/04/2022] lisdexamfetamine (VYVANSE) 50 MG capsule Take 1 capsule by mouth once daily (07/04/22) 30 capsule 0   norethindrone-ethinyl estradiol (LOESTRIN) 1-20 MG-MCG tablet TAKE 1 TABLET BY MOUTH ONCE DAILY 63 tablet 5   tretinoin (RETIN-A) 0.025 %  cream Apply a pea sized amount to dry face every other night 45 g 1   albuterol (VENTOLIN HFA) 108 (90 Base) MCG/ACT inhaler Inhale 2 puffs into the lungs every 6 (six) hours as needed for wheezing or shortness of breath. (Patient not taking: Reported on 06/29/2022) 6.7 g 0   buPROPion (WELLBUTRIN XL) 300 MG 24 hr tablet TAKE 1 TABLET BY MOUTH EVERY MORNING 90 tablet 1   spironolactone (ALDACTONE) 50 MG tablet TAKE 1 TABLET BY MOUTH TWO TIMES DAILY 60 tablet 3   No current facility-administered medications on file prior to visit.    Review of Systems  Constitutional:  Positive for fatigue. Negative for activity change, appetite change, fever and unexpected weight change.       Needs more sleep than most people   HENT:  Negative for congestion, ear pain, rhinorrhea, sinus pressure and sore throat.   Eyes:  Negative for pain, redness and visual disturbance.  Respiratory:  Positive for chest tightness and shortness of breath. Negative for apnea, cough, choking, wheezing and stridor.   Cardiovascular:  Negative for chest pain and palpitations.  Gastrointestinal:  Negative for abdominal pain, blood in stool, constipation and diarrhea.  Endocrine: Negative for polydipsia and polyuria.  Genitourinary:  Negative for dysuria, frequency and urgency.  Musculoskeletal:  Negative for arthralgias, back pain and myalgias.  Skin:  Negative for pallor and rash.  Allergic/Immunologic: Negative for environmental allergies.  Neurological:  Negative for dizziness, syncope and headaches.  Hematological:  Negative for adenopathy. Does not bruise/bleed easily.  Psychiatric/Behavioral:  Negative for decreased concentration and dysphoric mood. The patient is not nervous/anxious.        Objective:   Physical Exam Constitutional:      General: She is not in acute distress.    Appearance: Normal appearance. She is well-developed and normal weight. She is not ill-appearing or diaphoretic.     Comments: Athletic  build   HENT:     Head: Normocephalic and atraumatic.     Nose: Nose normal.     Mouth/Throat:     Mouth: Mucous membranes are moist.  Eyes:     Conjunctiva/sclera: Conjunctivae normal.     Pupils: Pupils are equal, round, and reactive to light.  Neck:     Thyroid: No thyromegaly.     Vascular: No carotid bruit or JVD.  Cardiovascular:     Rate and Rhythm: Normal rate and regular rhythm.     Heart sounds: Normal heart sounds.     No gallop.  Pulmonary:     Effort: Pulmonary effort is normal. No respiratory distress.     Breath sounds: Normal breath sounds. No stridor. No wheezing, rhonchi or rales.  Comments: Good air exch Chest:     Chest wall: No tenderness.  Abdominal:     General: There is no distension or abdominal bruit.     Palpations: Abdomen is soft. There is no mass.  Musculoskeletal:     Cervical back: Normal range of motion and neck supple.     Right lower leg: No edema.     Left lower leg: No edema.  Lymphadenopathy:     Cervical: No cervical adenopathy.  Skin:    General: Skin is warm and dry.     Coloration: Skin is not pale.     Findings: No rash.  Neurological:     Mental Status: She is alert.     Coordination: Coordination normal.     Deep Tendon Reflexes: Reflexes are normal and symmetric. Reflexes normal.  Psychiatric:        Mood and Affect: Mood normal.           Assessment & Plan:   Problem List Items Addressed This Visit       Other   Anxiety associated with depression    Under care of psychiatry  More stress with change in school plan  vyvanse helps concentration however  Continues wellbutrin  Wonder if sob is an anxiety symptom?  Reviewed stressors/ coping techniques/symptoms/ support sources/ tx options and side effects in detail today       History of anemia    Cbc and iron today  Has had heavy menses in past  Some sob recently  Good exercise tolerance however       Relevant Orders   CBC with Differential/Platelet    Iron   Ferritin   Shortness of breath - Primary    Pt reports difficulty getting a deep breath  No wheeze or stridor  Nl exam Good exercise tol  cxr today and anemia labs  Trial of albuterol mdi and report back  Disc poss of anxiety adding to this as well       Relevant Orders   DG Chest 2 View   CBC with Differential/Platelet   Iron   Ferritin

## 2022-06-29 NOTE — Assessment & Plan Note (Signed)
Cbc and iron today  Has had heavy menses in past  Some sob recently  Good exercise tolerance however

## 2022-06-30 ENCOUNTER — Encounter: Payer: Self-pay | Admitting: Family Medicine

## 2022-06-30 ENCOUNTER — Other Ambulatory Visit: Payer: Self-pay

## 2022-06-30 ENCOUNTER — Other Ambulatory Visit (INDEPENDENT_AMBULATORY_CARE_PROVIDER_SITE_OTHER): Payer: 59

## 2022-06-30 DIAGNOSIS — Z111 Encounter for screening for respiratory tuberculosis: Secondary | ICD-10-CM

## 2022-06-30 NOTE — Telephone Encounter (Signed)
Called and spoke with patient, she wanted to lab draw and not the skin test. Pts  coming at 12pm today to have this done.

## 2022-07-01 ENCOUNTER — Ambulatory Visit: Payer: Self-pay | Admitting: Family Medicine

## 2022-07-01 ENCOUNTER — Ambulatory Visit: Payer: 59

## 2022-07-01 ENCOUNTER — Encounter: Payer: Self-pay | Admitting: Family Medicine

## 2022-07-03 LAB — QUANTIFERON-TB GOLD PLUS
Mitogen-NIL: 7.62 IU/mL
NIL: 0.01 IU/mL
QuantiFERON-TB Gold Plus: NEGATIVE
TB1-NIL: 0 IU/mL
TB2-NIL: 0 IU/mL

## 2022-07-09 ENCOUNTER — Other Ambulatory Visit (HOSPITAL_COMMUNITY): Payer: Self-pay

## 2022-07-20 ENCOUNTER — Other Ambulatory Visit (HOSPITAL_COMMUNITY): Payer: Self-pay

## 2022-08-03 ENCOUNTER — Other Ambulatory Visit (HOSPITAL_COMMUNITY): Payer: Self-pay

## 2022-08-03 MED ORDER — DESVENLAFAXINE SUCCINATE ER 100 MG PO TB24
100.0000 mg | ORAL_TABLET | Freq: Every day | ORAL | 1 refills | Status: DC
  Filled 2022-08-03: qty 90, 90d supply, fill #0
  Filled 2022-11-08: qty 90, 90d supply, fill #1

## 2022-08-03 MED ORDER — LISDEXAMFETAMINE DIMESYLATE 50 MG PO CAPS
50.0000 mg | ORAL_CAPSULE | Freq: Every day | ORAL | 0 refills | Status: DC
  Filled 2022-08-03 – 2022-09-24 (×2): qty 90, 90d supply, fill #0

## 2022-08-03 MED ORDER — BUPROPION HCL ER (XL) 300 MG PO TB24
300.0000 mg | ORAL_TABLET | Freq: Every morning | ORAL | 1 refills | Status: DC
  Filled 2022-08-03 – 2022-10-09 (×2): qty 90, 90d supply, fill #0
  Filled 2022-11-08: qty 90, 90d supply, fill #1

## 2022-08-23 ENCOUNTER — Other Ambulatory Visit (HOSPITAL_COMMUNITY): Payer: Self-pay

## 2022-08-24 ENCOUNTER — Other Ambulatory Visit (HOSPITAL_COMMUNITY): Payer: Self-pay

## 2022-09-20 ENCOUNTER — Other Ambulatory Visit (HOSPITAL_COMMUNITY): Payer: Self-pay

## 2022-09-21 ENCOUNTER — Other Ambulatory Visit (HOSPITAL_COMMUNITY): Payer: Self-pay

## 2022-09-21 MED ORDER — AMPHETAMINE-DEXTROAMPHETAMINE 10 MG PO TABS
10.0000 mg | ORAL_TABLET | Freq: Every day | ORAL | 0 refills | Status: DC
  Filled 2022-09-21: qty 90, 90d supply, fill #0

## 2022-09-22 ENCOUNTER — Other Ambulatory Visit (HOSPITAL_COMMUNITY): Payer: Self-pay

## 2022-09-26 ENCOUNTER — Other Ambulatory Visit (HOSPITAL_COMMUNITY): Payer: Self-pay

## 2022-10-10 ENCOUNTER — Other Ambulatory Visit (HOSPITAL_COMMUNITY): Payer: Self-pay

## 2022-11-08 ENCOUNTER — Other Ambulatory Visit: Payer: Self-pay

## 2022-11-08 ENCOUNTER — Other Ambulatory Visit (HOSPITAL_COMMUNITY): Payer: Self-pay

## 2022-11-21 ENCOUNTER — Other Ambulatory Visit (HOSPITAL_COMMUNITY): Payer: Self-pay

## 2022-11-24 ENCOUNTER — Other Ambulatory Visit (HOSPITAL_COMMUNITY): Payer: Self-pay

## 2022-11-24 MED ORDER — LISDEXAMFETAMINE DIMESYLATE 50 MG PO CAPS
50.0000 mg | ORAL_CAPSULE | Freq: Every day | ORAL | 0 refills | Status: DC
  Filled 2022-12-31: qty 30, 30d supply, fill #0
  Filled 2023-01-24: qty 90, 90d supply, fill #0
  Filled 2023-01-24: qty 30, 30d supply, fill #0

## 2022-11-24 MED ORDER — BUPROPION HCL ER (XL) 300 MG PO TB24
300.0000 mg | ORAL_TABLET | Freq: Every morning | ORAL | 1 refills | Status: DC
  Filled 2022-11-24 – 2023-01-08 (×2): qty 90, 90d supply, fill #0
  Filled 2023-04-09: qty 90, 90d supply, fill #1

## 2022-11-24 MED ORDER — DESVENLAFAXINE SUCCINATE ER 100 MG PO TB24
100.0000 mg | ORAL_TABLET | Freq: Every day | ORAL | 1 refills | Status: DC
  Filled 2022-11-24 – 2023-02-11 (×2): qty 90, 90d supply, fill #0

## 2022-11-24 MED ORDER — AMPHETAMINE-DEXTROAMPHETAMINE 10 MG PO TABS
10.0000 mg | ORAL_TABLET | Freq: Every day | ORAL | 0 refills | Status: DC
  Filled 2023-02-13: qty 90, 90d supply, fill #0

## 2022-11-25 ENCOUNTER — Other Ambulatory Visit (HOSPITAL_COMMUNITY): Payer: Self-pay

## 2022-12-02 ENCOUNTER — Other Ambulatory Visit: Payer: Self-pay

## 2022-12-02 ENCOUNTER — Other Ambulatory Visit (HOSPITAL_COMMUNITY): Payer: Self-pay

## 2022-12-02 MED ORDER — NORETHINDRONE ACET-ETHINYL EST 1-20 MG-MCG PO TABS
1.0000 | ORAL_TABLET | Freq: Every day | ORAL | 0 refills | Status: DC
  Filled 2022-12-02: qty 63, 63d supply, fill #0

## 2023-01-02 ENCOUNTER — Other Ambulatory Visit: Payer: Self-pay

## 2023-01-02 ENCOUNTER — Other Ambulatory Visit (HOSPITAL_COMMUNITY): Payer: Self-pay

## 2023-01-09 ENCOUNTER — Other Ambulatory Visit: Payer: Self-pay

## 2023-01-09 ENCOUNTER — Other Ambulatory Visit (HOSPITAL_COMMUNITY): Payer: Self-pay

## 2023-01-11 ENCOUNTER — Other Ambulatory Visit: Payer: Self-pay

## 2023-01-11 ENCOUNTER — Other Ambulatory Visit (HOSPITAL_COMMUNITY): Payer: Self-pay

## 2023-01-24 ENCOUNTER — Encounter: Payer: Self-pay | Admitting: Pharmacist

## 2023-01-24 ENCOUNTER — Other Ambulatory Visit (HOSPITAL_COMMUNITY): Payer: Self-pay

## 2023-01-24 ENCOUNTER — Other Ambulatory Visit: Payer: Self-pay

## 2023-01-30 ENCOUNTER — Other Ambulatory Visit (HOSPITAL_COMMUNITY): Payer: Self-pay

## 2023-01-30 MED ORDER — LISDEXAMFETAMINE DIMESYLATE 50 MG PO CAPS
50.0000 mg | ORAL_CAPSULE | Freq: Every day | ORAL | 0 refills | Status: DC
  Filled 2023-04-20: qty 90, 90d supply, fill #0

## 2023-01-30 MED ORDER — AMPHETAMINE-DEXTROAMPHETAMINE 10 MG PO TABS: 10.0000 mg | ORAL_TABLET | Freq: Every day | ORAL | 0 refills | Status: DC

## 2023-02-11 ENCOUNTER — Other Ambulatory Visit (HOSPITAL_COMMUNITY): Payer: Self-pay

## 2023-02-13 ENCOUNTER — Other Ambulatory Visit: Payer: Self-pay

## 2023-02-13 ENCOUNTER — Other Ambulatory Visit (HOSPITAL_COMMUNITY): Payer: Self-pay

## 2023-02-14 ENCOUNTER — Other Ambulatory Visit (HOSPITAL_COMMUNITY): Payer: Self-pay

## 2023-02-14 ENCOUNTER — Other Ambulatory Visit: Payer: Self-pay

## 2023-02-22 ENCOUNTER — Other Ambulatory Visit (HOSPITAL_COMMUNITY): Payer: Self-pay

## 2023-02-27 ENCOUNTER — Other Ambulatory Visit (HOSPITAL_COMMUNITY): Payer: Self-pay

## 2023-02-27 MED ORDER — NORETHINDRONE ACET-ETHINYL EST 1-20 MG-MCG PO TABS
1.0000 | ORAL_TABLET | Freq: Every day | ORAL | 0 refills | Status: DC
  Filled 2023-02-27: qty 63, 63d supply, fill #0

## 2023-02-28 ENCOUNTER — Other Ambulatory Visit (HOSPITAL_COMMUNITY): Payer: Self-pay

## 2023-02-28 ENCOUNTER — Other Ambulatory Visit: Payer: Self-pay

## 2023-04-11 ENCOUNTER — Other Ambulatory Visit: Payer: Self-pay

## 2023-04-20 ENCOUNTER — Other Ambulatory Visit: Payer: Self-pay

## 2023-04-20 ENCOUNTER — Other Ambulatory Visit (HOSPITAL_COMMUNITY): Payer: Self-pay

## 2023-04-21 ENCOUNTER — Other Ambulatory Visit: Payer: Self-pay

## 2023-04-27 ENCOUNTER — Other Ambulatory Visit (HOSPITAL_COMMUNITY): Payer: Self-pay

## 2023-04-27 MED ORDER — NORETHINDRONE ACET-ETHINYL EST 1-20 MG-MCG PO TABS
1.0000 | ORAL_TABLET | Freq: Every day | ORAL | 4 refills | Status: DC
  Filled 2023-04-27: qty 84, 84d supply, fill #0
  Filled 2023-07-25: qty 84, 84d supply, fill #1
  Filled 2023-10-26: qty 84, 63d supply, fill #2
  Filled 2024-01-22: qty 84, 84d supply, fill #3
  Filled 2024-04-10: qty 84, 84d supply, fill #4

## 2023-05-02 ENCOUNTER — Other Ambulatory Visit (HOSPITAL_COMMUNITY): Payer: Self-pay

## 2023-05-02 MED ORDER — DOXYCYCLINE HYCLATE 100 MG PO TABS
100.0000 mg | ORAL_TABLET | Freq: Two times a day (BID) | ORAL | 0 refills | Status: DC
  Filled 2023-05-02: qty 14, 7d supply, fill #0

## 2023-05-03 ENCOUNTER — Other Ambulatory Visit: Payer: Self-pay

## 2023-05-09 LAB — HM PAP SMEAR

## 2023-05-16 ENCOUNTER — Other Ambulatory Visit: Payer: Self-pay

## 2023-05-16 ENCOUNTER — Other Ambulatory Visit (HOSPITAL_COMMUNITY): Payer: Self-pay

## 2023-05-16 MED ORDER — AMPHETAMINE-DEXTROAMPHETAMINE 10 MG PO TABS
10.0000 mg | ORAL_TABLET | Freq: Every day | ORAL | 0 refills | Status: DC
  Filled 2023-05-16: qty 90, 90d supply, fill #0

## 2023-05-16 MED ORDER — ALPRAZOLAM 0.5 MG PO TABS
0.5000 mg | ORAL_TABLET | Freq: Every day | ORAL | 0 refills | Status: DC
  Filled 2023-05-16: qty 30, 30d supply, fill #0

## 2023-05-16 MED ORDER — DESVENLAFAXINE SUCCINATE ER 100 MG PO TB24
100.0000 mg | ORAL_TABLET | Freq: Every day | ORAL | 1 refills | Status: DC
  Filled 2023-05-16: qty 90, 90d supply, fill #0
  Filled 2023-09-10: qty 90, 90d supply, fill #1

## 2023-05-16 MED ORDER — BUPROPION HCL ER (XL) 300 MG PO TB24
300.0000 mg | ORAL_TABLET | Freq: Every morning | ORAL | 1 refills | Status: DC
  Filled 2023-05-16 – 2023-07-05 (×2): qty 90, 90d supply, fill #0
  Filled 2023-10-09: qty 90, 90d supply, fill #1

## 2023-05-16 MED ORDER — LISDEXAMFETAMINE DIMESYLATE 50 MG PO CAPS
50.0000 mg | ORAL_CAPSULE | Freq: Every day | ORAL | 0 refills | Status: DC
  Filled 2023-05-16 – 2023-07-25 (×2): qty 90, 90d supply, fill #0

## 2023-07-05 ENCOUNTER — Other Ambulatory Visit: Payer: Self-pay

## 2023-07-11 ENCOUNTER — Telehealth: Payer: Self-pay | Admitting: Family Medicine

## 2023-07-11 NOTE — Telephone Encounter (Signed)
See message I sent to the pool, pt hasn't been seen in over a year. She needs a CPE or at least a f/u and we can discuss getting labs at that appt. Please schedule

## 2023-07-11 NOTE — Telephone Encounter (Signed)
Left another VM for patient to cb and schedule.   Previous message: Pt hasn't been seen in over a year. Please schedule a CPE or at least a f/u and we can do the test at the appt. **DO NOT SCHEDULE ON A THURSDAY*

## 2023-07-11 NOTE — Telephone Encounter (Signed)
Patient contacted the office regarding TB test, asked to have orders placed to have this drawn at the lab. Patient is needing this for school, no symptoms. Please advise patient if needed

## 2023-07-14 ENCOUNTER — Encounter: Payer: Self-pay | Admitting: Family Medicine

## 2023-07-14 ENCOUNTER — Ambulatory Visit (INDEPENDENT_AMBULATORY_CARE_PROVIDER_SITE_OTHER): Payer: 59 | Admitting: Family Medicine

## 2023-07-14 VITALS — BP 124/82 | HR 98 | Temp 98.0°F | Ht 65.5 in | Wt 144.4 lb

## 2023-07-14 DIAGNOSIS — F418 Other specified anxiety disorders: Secondary | ICD-10-CM | POA: Diagnosis not present

## 2023-07-14 DIAGNOSIS — Z23 Encounter for immunization: Secondary | ICD-10-CM

## 2023-07-14 DIAGNOSIS — Z111 Encounter for screening for respiratory tuberculosis: Secondary | ICD-10-CM

## 2023-07-14 NOTE — Patient Instructions (Addendum)
TB gold test today   Flu shot today    Keep taking good care of yourself   Good luck with school

## 2023-07-14 NOTE — Progress Notes (Unsigned)
Subjective:    Patient ID: Rachel [name], female    DOB: Jun 02, 2001, 22 y.o.   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)  Will be done in dec with her bachelors for nursing    Pt presents for annual follow up of chronic medical problems  Also needs TB screening for school   Wants a flu shot also   Feels ok in general  Wishes she could sleep longer - works early shift     Anxiety / depression -sees psychiatry  Also ADD Continues to be anxious - the medicines helps   Pristiq Vyvanse    Is lifting - weight training  Also is a traner    Gets gyn care with Dr Ellyn Hack Sent for last pap report  Has had std screening this summer  On Oc loestrin fe 1/20      Patient Active Problem List   Diagnosis Date Noted   Shortness of breath 06/29/2022   History of anemia 06/29/2022   Routine general medical examination at a health care facility 03/26/2021   Screening-pulmonary TB 03/26/2021   Lesion of spleen 06/05/2019   Encounter for sickle-cell screening 06/05/2017   Stress reaction 01/19/2016   Well adolescent visit 10/26/2012   Anxiety associated with depression 08/02/2011   Past Medical History:  Diagnosis Date   Anemia    Anxiety    Depression    Hydrosalpinx    Past Surgical History:  Procedure Laterality Date   LABIOPLASTY N/A 12/20/2019   Procedure: LABIAPLASTY;  Surgeon: Sherian Rein, MD;  Location: [facility];  Service: Gynecology;  Laterality: N/A;   LAPAROSCOPY N/A 12/20/2019   Procedure: LAPAROSCOPY OPERATIVE;  Surgeon: Sherian Rein, MD;  Location: [facility];  Service: Gynecology;  Laterality: N/A;  request RNFA Kassie RNFA confirmed on  11/22/19 CS   TONSILLECTOMY AND ADENOIDECTOMY  2006   UNILATERAL SALPINGECTOMY Left 12/20/2019   Procedure: LAPAROSCOPIC UNILATERAL SALPINGECTOMY;  Surgeon: Sherian Rein, MD;  Location: [facility];  Service: Gynecology;  Laterality: Left;   Social History   Tobacco Use   Smoking status: Never   Smokeless tobacco: Never  Vaping Use   Vaping status: Never Used  Substance Use Topics   Alcohol use: Yes    Alcohol/week: 0.0 standard drinks of alcohol    Comment: occ   Drug use: No   Family History  Problem Relation Age of Onset   Anxiety disorder Mother    Anxiety disorder Father    Cancer Maternal Grandmother        breast CA   Hyperlipidemia Maternal Grandmother    Hypertension Maternal Grandfather    Diabetes Paternal Grandmother    Hypertension Paternal Grandfather    No Known Allergies Current Outpatient Medications on File Prior to Visit  Medication Sig Dispense Refill   ALPRAZolam (XANAX) 0.5 MG tablet Take 1 tablet (0.5 mg total) by mouth daily. 30 tablet 0   amphetamine-dextroamphetamine (ADDERALL) 10 MG tablet Take 1 tablet (10 mg total) by mouth daily in the afternoon 90 tablet 0   buPROPion (WELLBUTRIN XL) 300 MG 24 hr tablet Take  1 tablet (300 mg total) by mouth every morning. 90 tablet 1   desvenlafaxine (PRISTIQ) 100 MG 24 hr tablet Take 1 tablet (100 mg total) by mouth daily. 90 tablet 1   lisdexamfetamine (VYVANSE) 50 MG capsule Take 1 capsule (50 mg total) by mouth daily. 90 capsule 0   norethindrone-ethinyl estradiol (LOESTRIN) 1-20 MG-MCG tablet Take 1 tablet by mouth daily. 84 tablet 4   tretinoin (RETIN-A) 0.025 % cream Apply a pea sized amount to dry face every other night 45 g 1   No current facility-administered medications on file prior to visit.    Review of Systems     Objective:   Physical Exam        Assessment & Plan:   Problem List Items Addressed This Visit       Other   Screening-pulmonary TB - Primary

## 2023-07-16 LAB — QUANTIFERON-TB GOLD PLUS
Mitogen-NIL: >10 IU/mL
NIL: 0.02 [IU]/mL
QuantiFERON-TB Gold Plus: NEGATIVE
TB1-NIL: 0.01 [IU]/mL
TB2-NIL: 0 [IU]/mL

## 2023-07-16 NOTE — Assessment & Plan Note (Signed)
TB screening for school today  TB gold ordered  Not high risk

## 2023-07-16 NOTE — Assessment & Plan Note (Addendum)
Under care of psychistry  Pristiq and vyvanse (ADD also?) Wellbutrin  Xanax prn  Is exxercising fnd feels good overall  Encouraged her to keep up good self care   Has changed educational focus/ nursing school  High stress but doing well  Still has some moments of anxiety

## 2023-07-18 ENCOUNTER — Telehealth: Payer: Self-pay | Admitting: Family Medicine

## 2023-07-18 NOTE — Telephone Encounter (Signed)
Patient returned call regarding TB lab results. I let her know that herr results were negative.She said that it would be okay for the results to be mailed to her.

## 2023-07-19 ENCOUNTER — Encounter: Payer: Self-pay | Admitting: *Deleted

## 2023-07-19 NOTE — Telephone Encounter (Signed)
Letter mailed with results. 

## 2023-07-25 ENCOUNTER — Other Ambulatory Visit (HOSPITAL_COMMUNITY): Payer: Self-pay

## 2023-07-25 ENCOUNTER — Other Ambulatory Visit: Payer: Self-pay

## 2023-08-09 ENCOUNTER — Other Ambulatory Visit (HOSPITAL_COMMUNITY): Payer: Self-pay

## 2023-08-09 MED ORDER — AMPHETAMINE-DEXTROAMPHETAMINE 10 MG PO TABS
10.0000 mg | ORAL_TABLET | Freq: Every day | ORAL | 0 refills | Status: AC
  Filled 2023-08-09: qty 90, 90d supply, fill #0

## 2023-08-09 MED ORDER — LISDEXAMFETAMINE DIMESYLATE 50 MG PO CAPS
50.0000 mg | ORAL_CAPSULE | Freq: Every day | ORAL | 0 refills | Status: DC
  Filled 2023-08-09: qty 90, 90d supply, fill #0

## 2023-08-09 MED ORDER — ALPRAZOLAM 0.5 MG PO TABS
0.5000 mg | ORAL_TABLET | Freq: Every day | ORAL | 0 refills | Status: AC
  Filled 2023-08-09: qty 30, 30d supply, fill #0

## 2023-08-10 ENCOUNTER — Other Ambulatory Visit (HOSPITAL_COMMUNITY): Payer: Self-pay

## 2023-08-10 ENCOUNTER — Other Ambulatory Visit: Payer: Self-pay

## 2023-08-10 MED ORDER — LISDEXAMFETAMINE DIMESYLATE 60 MG PO CAPS
60.0000 mg | ORAL_CAPSULE | Freq: Every day | ORAL | 0 refills | Status: DC
  Filled 2023-08-10: qty 90, 90d supply, fill #0

## 2023-08-11 ENCOUNTER — Other Ambulatory Visit (HOSPITAL_COMMUNITY): Payer: Self-pay

## 2023-09-11 ENCOUNTER — Other Ambulatory Visit: Payer: Self-pay

## 2023-10-09 ENCOUNTER — Other Ambulatory Visit (HOSPITAL_COMMUNITY): Payer: Self-pay

## 2023-10-27 ENCOUNTER — Other Ambulatory Visit (HOSPITAL_COMMUNITY): Payer: Self-pay

## 2023-11-19 ENCOUNTER — Other Ambulatory Visit (HOSPITAL_COMMUNITY): Payer: Self-pay

## 2023-12-10 ENCOUNTER — Other Ambulatory Visit (HOSPITAL_COMMUNITY): Payer: Self-pay

## 2023-12-11 ENCOUNTER — Other Ambulatory Visit (HOSPITAL_COMMUNITY): Payer: Self-pay

## 2023-12-11 MED ORDER — LISDEXAMFETAMINE DIMESYLATE 60 MG PO CAPS
60.0000 mg | ORAL_CAPSULE | Freq: Every day | ORAL | 0 refills | Status: AC
  Filled 2023-12-18 – 2023-12-19 (×2): qty 90, 90d supply, fill #0

## 2023-12-12 ENCOUNTER — Other Ambulatory Visit: Payer: Self-pay

## 2023-12-12 ENCOUNTER — Other Ambulatory Visit (HOSPITAL_COMMUNITY): Payer: Self-pay

## 2023-12-12 MED ORDER — ALPRAZOLAM 0.5 MG PO TABS
0.5000 mg | ORAL_TABLET | Freq: Every day | ORAL | 0 refills | Status: AC
  Filled 2023-12-12: qty 30, 30d supply, fill #0

## 2023-12-12 MED ORDER — DESVENLAFAXINE SUCCINATE ER 100 MG PO TB24
100.0000 mg | ORAL_TABLET | Freq: Every day | ORAL | 1 refills | Status: AC
  Filled 2023-12-12: qty 90, 90d supply, fill #0
  Filled 2024-04-10: qty 90, 90d supply, fill #1

## 2023-12-12 MED ORDER — BUPROPION HCL ER (XL) 300 MG PO TB24
300.0000 mg | ORAL_TABLET | Freq: Every morning | ORAL | 1 refills | Status: DC
  Filled 2023-12-12 – 2024-01-22 (×2): qty 90, 90d supply, fill #0
  Filled 2024-04-20: qty 90, 90d supply, fill #1

## 2023-12-15 ENCOUNTER — Telehealth: Payer: Commercial Managed Care - PPO | Admitting: Family Medicine

## 2023-12-15 ENCOUNTER — Telehealth: Payer: 59 | Admitting: Family Medicine

## 2023-12-15 DIAGNOSIS — R519 Headache, unspecified: Secondary | ICD-10-CM

## 2023-12-15 DIAGNOSIS — R591 Generalized enlarged lymph nodes: Secondary | ICD-10-CM | POA: Diagnosis not present

## 2023-12-15 DIAGNOSIS — J029 Acute pharyngitis, unspecified: Secondary | ICD-10-CM

## 2023-12-15 DIAGNOSIS — J069 Acute upper respiratory infection, unspecified: Secondary | ICD-10-CM

## 2023-12-15 MED ORDER — NAPROXEN 500 MG PO TABS
500.0000 mg | ORAL_TABLET | Freq: Two times a day (BID) | ORAL | 0 refills | Status: AC
Start: 2023-12-15 — End: 2023-12-29
  Filled 2023-12-15 – 2023-12-16 (×2): qty 20, 10d supply, fill #0

## 2023-12-15 MED ORDER — AMOXICILLIN 875 MG PO TABS
875.0000 mg | ORAL_TABLET | Freq: Two times a day (BID) | ORAL | 0 refills | Status: AC
Start: 2023-12-15 — End: 2023-12-29
  Filled 2023-12-15 – 2023-12-16 (×2): qty 20, 10d supply, fill #0

## 2023-12-15 NOTE — Patient Instructions (Signed)
General Headache Without Cause A headache is pain or discomfort felt around the head or neck area. There are many causes and types of headaches. A few common types include: Tension headaches. Migraine headaches. Cluster headaches. Chronic daily headaches. Sometimes, the specific cause of a headache may not be found. Follow these instructions at home: Watch your condition for any changes. Let your health care provider know about them. Take these steps to help with your condition: Managing pain     Take over-the-counter and prescription medicines only as told by your health care provider. Treatment may include medicines for pain that are taken by mouth or applied to the skin. Lie down in a dark, quiet room when you have a headache. Keep lights dim if bright lights bother you or make your headaches worse. If directed, put ice on your head and neck area: Put ice in a plastic bag. Place a towel between your skin and the bag. Leave the ice on for 20 minutes, 2-3 times per day. Remove the ice if your skin turns bright red. This is very important. If you cannot feel pain, heat, or cold, you have a greater risk of damage to the area. If directed, apply heat to the affected area. Use the heat source that your health care provider recommends, such as a moist heat pack or a heating pad. Place a towel between your skin and the heat source. Leave the heat on for 20-30 minutes. Remove the heat if your skin turns bright red. This is especially important if you are unable to feel pain, heat, or cold. You have a greater risk of getting burned. Eating and drinking Eat meals on a regular schedule. If you drink alcohol: Limit how much you have to: 0-1 drink a day for women who are not pregnant. 0-2 drinks a day for men. Know how much alcohol is in a drink. In the [place] one drink equals one 12 oz bottle of beer (355 mL), one 5 oz glass of wine (148 mL), or one 1 oz glass of hard liquor (44 mL). Stop  drinking caffeine, or decrease the amount of caffeine you drink. Drink enough fluid to keep your urine pale yellow. General instructions  Keep a headache journal to help find out what may trigger your headaches. For example, write down: What you eat and drink. How much sleep you get. Any change to your diet or medicines. Try massage or other relaxation techniques. Limit stress. Sit up straight, and do not tense your muscles. Do not use any products that contain nicotine or tobacco. These products include cigarettes, chewing tobacco, and vaping devices, such as e-cigarettes. If you need help quitting, ask your health care provider. Exercise regularly as told by your health care provider. Sleep on a regular schedule. Get 7-9 hours of sleep each night, or the amount recommended by your health care provider. Keep all follow-up visits. This is important. Contact a health care provider if: Medicine does not help your symptoms. You have a headache that is different from your usual headache. You have nausea or you vomit. You have a fever. Get help right away if: Your headache: Becomes severe quickly. Gets worse after moderate to intense physical activity. You have any of these symptoms: Repeated vomiting. Pain or stiffness in your neck. Changes to your vision. Pain in an eye or ear. Problems with speech. Muscular weakness or loss of muscle control. Loss of balance or coordination. You feel faint or pass out. You have confusion. You have  a seizure. These symptoms may represent a serious problem that is an emergency. Do not wait to see if the symptoms will go away. Get medical help right away. Call your local emergency services (911 in the [place] Do not drive yourself to the hospital. Summary A headache is pain or discomfort felt around the head or neck area. There are many causes and types of headaches. In some cases, the cause may not be found. Keep a headache journal to help find out  what may trigger your headaches. Watch your condition for any changes. Let your health care provider know about them. Contact a health care provider if you have a headache that is different from the usual headache, or if your symptoms are not helped by medicine. Get help right away if your headache becomes severe, you vomit, you have a loss of vision, you lose your balance, or you have a seizure. This information is not intended to replace advice given to you by your health care provider. Make sure you discuss any questions you have with your health care provider. Document Revised: 03/31/2021 Document Reviewed: 03/31/2021 Elsevier Patient Education  2024 [employer].

## 2023-12-15 NOTE — Progress Notes (Signed)
   Thank you for the details you included in the comment boxes. Those details are very helpful in determining the best course of treatment for you and help [place] to provide the best care.Because Ms. Brickley, we recommend that you schedule a Virtual Urgent Care video visit in order for the provider to better assess what is going on.  The provider will be able to give you a more accurate diagnosis and treatment plan if we can more freely discuss your symptoms and with the addition of a virtual examination.   If you change your visit to a video visit, we will bill your insurance (similar to an office visit) and you will not be charged for this e-Visit. You will be able to stay at home and speak with the first available [facility] advanced practice provider. The link to do a video visit is in the drop down Menu tab of your Welcome screen in MyChart.

## 2023-12-15 NOTE — Progress Notes (Signed)
Virtual Visit Consent   Rachel [name], you are scheduled for a virtual visit with a [facility] provider today. Just as with appointments in the office, your consent must be obtained to participate. Your consent will be active for this visit and any virtual visit you may have with one of our providers in the next 365 days. If you have a MyChart account, a copy of this consent can be sent to you electronically.  As this is a virtual visit, video technology does not allow for your provider to perform a traditional examination. This may limit your provider's ability to fully assess your condition. If your provider identifies any concerns that need to be evaluated in person or the need to arrange testing (such as labs, EKG, etc.), we will make arrangements to do so. Although advances in technology are sophisticated, we cannot ensure that it will always work on either your end or our end. If the connection with a video visit is poor, the visit may have to be switched to a telephone visit. With either a video or telephone visit, we are not always able to ensure that we have a secure connection.  By engaging in this virtual visit, you consent to the provision of healthcare and authorize for your insurance to be billed (if applicable) for the services provided during this visit. Depending on your insurance coverage, you may receive a charge related to this service.  I need to obtain your verbal consent now. Are you willing to proceed with your visit today? Rachel [name] has provided verbal consent on 12/15/2023 for a virtual visit (video or telephone). Georgana Curio, FNP  Date: 12/15/2023 4:35 PM  Virtual Visit via Video Note   I, Georgana Curio, connected with  Rachel [name]  ([id number], 10/21/2001) on 12/15/23 at  4:30 PM EST by a video-enabled telemedicine application and verified that I am speaking with the correct person using two identifiers.  Location: Patient: Virtual Visit Location Patient:  Home Provider: Virtual Visit Location Provider: Home Office   I discussed the limitations of evaluation and management by telemedicine and the availability of in person appointments. The patient expressed understanding and agreed to proceed.    History of Present Illness: Rachel [name] is a 23 y.o. who identifies as a female who was assigned female at birth, and is being seen today for sore throat. Enlarged lymph nodes. Headache. No fever, chills bodyu aches, cough or excessive fatigue. Marland Kitchen  HPI: HPI  Problems:  Patient Active Problem List   Diagnosis Date Noted   History of anemia 06/29/2022   Routine general medical examination at a health care facility 03/26/2021   Screening-pulmonary TB 03/26/2021   Lesion of spleen 06/05/2019   Encounter for sickle-cell screening 06/05/2017   Stress reaction 01/19/2016   Anxiety associated with depression 08/02/2011    Allergies: No Known Allergies Medications:  Current Outpatient Medications:    ALPRAZolam (XANAX) 0.5 MG tablet, Take 1 tablet (0.5 mg total) by mouth daily., Disp: 30 tablet, Rfl: 0   ALPRAZolam (XANAX) 0.5 MG tablet, Take 1 tablet (0.5 mg total) by mouth daily., Disp: 30 tablet, Rfl: 0   amphetamine-dextroamphetamine (ADDERALL) 10 MG tablet, Take 1 tablet (10 mg total) by mouth daily in the afternoon., Disp: 90 tablet, Rfl: 0   buPROPion (WELLBUTRIN XL) 300 MG 24 hr tablet, Take 1 tablet (300 mg total) by mouth in the morning., Disp: 90 tablet, Rfl: 1   desvenlafaxine (PRISTIQ) 100 MG 24 hr tablet, Take  1 tablet (100 mg total) by mouth daily., Disp: 90 tablet, Rfl: 1   lisdexamfetamine (VYVANSE) 60 MG capsule, Take 1 capsule (60 mg total) by mouth daily., Disp: 90 capsule, Rfl: 0   norethindrone-ethinyl estradiol (LOESTRIN) 1-20 MG-MCG tablet, Take 1 tablet by mouth daily., Disp: 84 tablet, Rfl: 4   tretinoin (RETIN-A) 0.025 % cream, Apply a pea sized amount to dry face every other night, Disp: 45 g, Rfl:  1  Observations/Objective: Patient is well-developed, well-nourished in no acute distress.  Resting comfortably  at home.  Head is normocephalic, atraumatic.  No labored breathing.  Speech is clear and coherent with logical content.  Patient is alert and oriented at baseline.    Assessment and Plan: 1. Pharyngitis, unspecified etiology (Primary)  2. Lymphadenopathy  Increase fluids, warm salt water gargles, ibuprofen as directed, UC if sx persist or worsen.   Follow Up Instructions: I discussed the assessment and treatment plan with the patient. The patient was provided an opportunity to ask questions and all were answered. The patient agreed with the plan and demonstrated an understanding of the instructions.  A copy of instructions were sent to the patient via MyChart unless otherwise noted below.   Patient has requested to receive PHI (AVS, Work Notes, etc) pertaining to this video visit through e-mail as they are currently without active MyChart. They have voiced understand that email is not considered secure and their health information could be viewed by someone other than the patient.   The patient was advised to call back or seek an in-person evaluation if the symptoms worsen or if the condition fails to improve as anticipated.    Georgana Curio, FNP

## 2023-12-16 ENCOUNTER — Other Ambulatory Visit (HOSPITAL_COMMUNITY): Payer: Self-pay

## 2023-12-18 ENCOUNTER — Encounter: Payer: Self-pay | Admitting: Pharmacist

## 2023-12-18 ENCOUNTER — Other Ambulatory Visit (HOSPITAL_COMMUNITY): Payer: Self-pay

## 2023-12-18 ENCOUNTER — Other Ambulatory Visit: Payer: Self-pay

## 2023-12-19 ENCOUNTER — Other Ambulatory Visit (HOSPITAL_COMMUNITY): Payer: Self-pay

## 2023-12-19 ENCOUNTER — Other Ambulatory Visit: Payer: Self-pay

## 2023-12-22 ENCOUNTER — Other Ambulatory Visit (HOSPITAL_COMMUNITY): Payer: Self-pay

## 2024-01-22 ENCOUNTER — Other Ambulatory Visit (HOSPITAL_COMMUNITY): Payer: Self-pay

## 2024-01-22 ENCOUNTER — Other Ambulatory Visit: Payer: Self-pay

## 2024-01-22 DIAGNOSIS — F4323 Adjustment disorder with mixed anxiety and depressed mood: Secondary | ICD-10-CM | POA: Diagnosis not present

## 2024-01-22 DIAGNOSIS — F411 Generalized anxiety disorder: Secondary | ICD-10-CM | POA: Diagnosis not present

## 2024-01-22 DIAGNOSIS — F900 Attention-deficit hyperactivity disorder, predominantly inattentive type: Secondary | ICD-10-CM | POA: Diagnosis not present

## 2024-01-22 DIAGNOSIS — F330 Major depressive disorder, recurrent, mild: Secondary | ICD-10-CM | POA: Diagnosis not present

## 2024-01-22 MED ORDER — DESVENLAFAXINE SUCCINATE ER 25 MG PO TB24
25.0000 mg | ORAL_TABLET | Freq: Every day | ORAL | 0 refills | Status: DC
  Filled 2024-01-22: qty 90, 90d supply, fill #0

## 2024-01-22 MED ORDER — DESVENLAFAXINE SUCCINATE ER 50 MG PO TB24
50.0000 mg | ORAL_TABLET | Freq: Every day | ORAL | 0 refills | Status: DC
  Filled 2024-01-22: qty 90, 90d supply, fill #0

## 2024-01-29 ENCOUNTER — Other Ambulatory Visit: Payer: Self-pay

## 2024-01-29 ENCOUNTER — Other Ambulatory Visit (HOSPITAL_COMMUNITY): Payer: Self-pay

## 2024-01-29 MED ORDER — ONDANSETRON 8 MG PO TBDP
8.0000 mg | ORAL_TABLET | Freq: Three times a day (TID) | ORAL | 0 refills | Status: AC | PRN
  Filled 2024-01-29: qty 15, 5d supply, fill #0

## 2024-01-29 MED ORDER — HYDROCODONE-ACETAMINOPHEN 5-325 MG PO TABS
1.0000 | ORAL_TABLET | ORAL | 0 refills | Status: AC
Start: 2024-01-29 — End: ?
  Filled 2024-01-29: qty 40, 5d supply, fill #0

## 2024-02-13 ENCOUNTER — Other Ambulatory Visit (HOSPITAL_COMMUNITY): Payer: Self-pay

## 2024-02-13 DIAGNOSIS — F411 Generalized anxiety disorder: Secondary | ICD-10-CM | POA: Diagnosis not present

## 2024-02-13 DIAGNOSIS — F330 Major depressive disorder, recurrent, mild: Secondary | ICD-10-CM | POA: Diagnosis not present

## 2024-02-13 DIAGNOSIS — F900 Attention-deficit hyperactivity disorder, predominantly inattentive type: Secondary | ICD-10-CM | POA: Diagnosis not present

## 2024-02-13 DIAGNOSIS — F4323 Adjustment disorder with mixed anxiety and depressed mood: Secondary | ICD-10-CM | POA: Diagnosis not present

## 2024-02-13 MED ORDER — DESVENLAFAXINE SUCCINATE ER 25 MG PO TB24
25.0000 mg | ORAL_TABLET | Freq: Every day | ORAL | 0 refills | Status: DC
  Filled 2024-04-20: qty 90, 90d supply, fill #0

## 2024-02-13 MED ORDER — DESVENLAFAXINE SUCCINATE ER 50 MG PO TB24
50.0000 mg | ORAL_TABLET | Freq: Every day | ORAL | 0 refills | Status: DC
  Filled 2024-04-20: qty 90, 90d supply, fill #0

## 2024-02-13 MED ORDER — LISDEXAMFETAMINE DIMESYLATE 60 MG PO CAPS
60.0000 mg | ORAL_CAPSULE | Freq: Every day | ORAL | 0 refills | Status: AC
  Filled 2024-03-26: qty 90, 90d supply, fill #0

## 2024-03-26 ENCOUNTER — Other Ambulatory Visit: Payer: Self-pay

## 2024-03-26 ENCOUNTER — Other Ambulatory Visit (HOSPITAL_COMMUNITY): Payer: Self-pay

## 2024-04-11 ENCOUNTER — Other Ambulatory Visit (HOSPITAL_COMMUNITY): Payer: Self-pay

## 2024-04-17 ENCOUNTER — Other Ambulatory Visit (HOSPITAL_COMMUNITY): Payer: Self-pay

## 2024-04-17 MED ORDER — PROMETHAZINE HCL 12.5 MG PO TABS
12.5000 mg | ORAL_TABLET | Freq: Four times a day (QID) | ORAL | 0 refills | Status: AC | PRN
  Filled 2024-04-17 – 2024-04-18 (×2): qty 8, 2d supply, fill #0

## 2024-04-17 MED ORDER — CEPHALEXIN 500 MG PO CAPS
500.0000 mg | ORAL_CAPSULE | Freq: Four times a day (QID) | ORAL | 0 refills | Status: AC
  Filled 2024-04-17 – 2024-04-18 (×2): qty 12, 3d supply, fill #0

## 2024-04-17 MED ORDER — TRAMADOL HCL 50 MG PO TABS
50.0000 mg | ORAL_TABLET | ORAL | 0 refills | Status: AC
  Filled 2024-04-17: qty 30, 5d supply, fill #0
  Filled 2024-04-18: qty 20, 5d supply, fill #0

## 2024-04-18 ENCOUNTER — Other Ambulatory Visit: Payer: Self-pay

## 2024-04-18 ENCOUNTER — Other Ambulatory Visit (HOSPITAL_COMMUNITY): Payer: Self-pay

## 2024-04-18 ENCOUNTER — Encounter: Payer: Self-pay | Admitting: Pharmacist

## 2024-04-20 ENCOUNTER — Other Ambulatory Visit (HOSPITAL_BASED_OUTPATIENT_CLINIC_OR_DEPARTMENT_OTHER): Payer: Self-pay

## 2024-04-20 ENCOUNTER — Other Ambulatory Visit (HOSPITAL_COMMUNITY): Payer: Self-pay

## 2024-04-22 ENCOUNTER — Other Ambulatory Visit: Payer: Self-pay

## 2024-05-01 ENCOUNTER — Other Ambulatory Visit (HOSPITAL_COMMUNITY): Payer: Self-pay

## 2024-05-01 DIAGNOSIS — F330 Major depressive disorder, recurrent, mild: Secondary | ICD-10-CM | POA: Diagnosis not present

## 2024-05-01 DIAGNOSIS — F900 Attention-deficit hyperactivity disorder, predominantly inattentive type: Secondary | ICD-10-CM | POA: Diagnosis not present

## 2024-05-01 DIAGNOSIS — F4323 Adjustment disorder with mixed anxiety and depressed mood: Secondary | ICD-10-CM | POA: Diagnosis not present

## 2024-05-01 DIAGNOSIS — F411 Generalized anxiety disorder: Secondary | ICD-10-CM | POA: Diagnosis not present

## 2024-05-01 MED ORDER — BUPROPION HCL ER (XL) 300 MG PO TB24
300.0000 mg | ORAL_TABLET | Freq: Every morning | ORAL | 1 refills | Status: AC
  Filled 2024-07-17: qty 90, 90d supply, fill #0

## 2024-05-01 MED ORDER — DESVENLAFAXINE SUCCINATE ER 50 MG PO TB24
50.0000 mg | ORAL_TABLET | Freq: Every day | ORAL | 1 refills | Status: AC
  Filled 2024-05-01 – 2024-07-17 (×2): qty 90, 90d supply, fill #0

## 2024-05-01 MED ORDER — LISDEXAMFETAMINE DIMESYLATE 60 MG PO CAPS
60.0000 mg | ORAL_CAPSULE | Freq: Every day | ORAL | 0 refills | Status: AC
  Filled 2024-06-27: qty 90, 90d supply, fill #0

## 2024-05-01 MED ORDER — DESVENLAFAXINE SUCCINATE ER 25 MG PO TB24
25.0000 mg | ORAL_TABLET | Freq: Every day | ORAL | 1 refills | Status: AC
  Filled 2024-07-17: qty 90, 90d supply, fill #0

## 2024-05-02 ENCOUNTER — Other Ambulatory Visit (HOSPITAL_COMMUNITY): Payer: Self-pay

## 2024-05-02 DIAGNOSIS — Z113 Encounter for screening for infections with a predominantly sexual mode of transmission: Secondary | ICD-10-CM | POA: Diagnosis not present

## 2024-05-02 DIAGNOSIS — Z1389 Encounter for screening for other disorder: Secondary | ICD-10-CM | POA: Diagnosis not present

## 2024-05-02 DIAGNOSIS — Z3040 Encounter for surveillance of contraceptives, unspecified: Secondary | ICD-10-CM | POA: Diagnosis not present

## 2024-05-02 DIAGNOSIS — Z01419 Encounter for gynecological examination (general) (routine) without abnormal findings: Secondary | ICD-10-CM | POA: Diagnosis not present

## 2024-05-02 MED ORDER — NORETHINDRONE ACET-ETHINYL EST 1-20 MG-MCG PO TABS
1.0000 | ORAL_TABLET | Freq: Every day | ORAL | 4 refills | Status: AC
  Filled 2024-06-27: qty 84, 84d supply, fill #0
  Filled 2024-09-25 – 2024-10-07 (×2): qty 84, 63d supply, fill #1

## 2024-05-09 ENCOUNTER — Other Ambulatory Visit (HOSPITAL_COMMUNITY): Payer: Self-pay

## 2024-06-13 ENCOUNTER — Other Ambulatory Visit (HOSPITAL_BASED_OUTPATIENT_CLINIC_OR_DEPARTMENT_OTHER): Payer: Self-pay

## 2024-06-13 ENCOUNTER — Other Ambulatory Visit (HOSPITAL_COMMUNITY): Payer: Self-pay

## 2024-06-13 DIAGNOSIS — N898 Other specified noninflammatory disorders of vagina: Secondary | ICD-10-CM | POA: Diagnosis not present

## 2024-06-13 DIAGNOSIS — N760 Acute vaginitis: Secondary | ICD-10-CM | POA: Diagnosis not present

## 2024-06-13 DIAGNOSIS — B9689 Other specified bacterial agents as the cause of diseases classified elsewhere: Secondary | ICD-10-CM | POA: Diagnosis not present

## 2024-06-13 DIAGNOSIS — R1900 Intra-abdominal and pelvic swelling, mass and lump, unspecified site: Secondary | ICD-10-CM | POA: Diagnosis not present

## 2024-06-13 DIAGNOSIS — N8189 Other female genital prolapse: Secondary | ICD-10-CM | POA: Diagnosis not present

## 2024-06-13 MED ORDER — METRONIDAZOLE 500 MG PO TABS
500.0000 mg | ORAL_TABLET | Freq: Two times a day (BID) | ORAL | 0 refills | Status: AC
  Filled 2024-06-13: qty 14, 7d supply, fill #0

## 2024-06-14 ENCOUNTER — Other Ambulatory Visit: Payer: Self-pay

## 2024-06-27 ENCOUNTER — Other Ambulatory Visit (HOSPITAL_COMMUNITY): Payer: Self-pay

## 2024-06-27 ENCOUNTER — Other Ambulatory Visit: Payer: Self-pay

## 2024-06-28 ENCOUNTER — Other Ambulatory Visit: Payer: Self-pay

## 2024-06-28 ENCOUNTER — Other Ambulatory Visit (HOSPITAL_COMMUNITY): Payer: Self-pay

## 2024-07-17 ENCOUNTER — Other Ambulatory Visit (HOSPITAL_COMMUNITY): Payer: Self-pay

## 2024-07-17 ENCOUNTER — Other Ambulatory Visit: Payer: Self-pay

## 2024-07-23 ENCOUNTER — Other Ambulatory Visit (HOSPITAL_COMMUNITY): Payer: Self-pay

## 2024-07-23 ENCOUNTER — Other Ambulatory Visit: Payer: Self-pay

## 2024-07-23 DIAGNOSIS — F411 Generalized anxiety disorder: Secondary | ICD-10-CM | POA: Diagnosis not present

## 2024-07-23 DIAGNOSIS — F4323 Adjustment disorder with mixed anxiety and depressed mood: Secondary | ICD-10-CM | POA: Diagnosis not present

## 2024-07-23 DIAGNOSIS — F900 Attention-deficit hyperactivity disorder, predominantly inattentive type: Secondary | ICD-10-CM | POA: Diagnosis not present

## 2024-07-23 DIAGNOSIS — F330 Major depressive disorder, recurrent, mild: Secondary | ICD-10-CM | POA: Diagnosis not present

## 2024-07-23 MED ORDER — LISDEXAMFETAMINE DIMESYLATE 60 MG PO CAPS
60.0000 mg | ORAL_CAPSULE | Freq: Every day | ORAL | 0 refills | Status: AC
  Filled 2024-09-25 – 2024-10-15 (×2): qty 90, 90d supply, fill #0

## 2024-07-23 MED ORDER — ALPRAZOLAM 0.5 MG PO TABS
0.5000 mg | ORAL_TABLET | Freq: Every day | ORAL | 0 refills | Status: DC
  Filled 2024-07-23: qty 30, 30d supply, fill #0

## 2024-08-08 ENCOUNTER — Other Ambulatory Visit: Payer: Self-pay

## 2024-08-08 ENCOUNTER — Other Ambulatory Visit (HOSPITAL_COMMUNITY): Payer: Self-pay

## 2024-08-08 ENCOUNTER — Encounter (HOSPITAL_COMMUNITY): Payer: Self-pay

## 2024-08-08 DIAGNOSIS — L700 Acne vulgaris: Secondary | ICD-10-CM | POA: Diagnosis not present

## 2024-08-08 MED ORDER — TRETINOIN 0.025 % EX CREA
TOPICAL_CREAM | CUTANEOUS | 3 refills | Status: AC
  Filled 2024-08-08: qty 20, 30d supply, fill #0

## 2024-08-08 MED ORDER — SPIRONOLACTONE 50 MG PO TABS
50.0000 mg | ORAL_TABLET | Freq: Two times a day (BID) | ORAL | 1 refills | Status: AC
  Filled 2024-08-08: qty 180, 90d supply, fill #0
  Filled 2024-11-01: qty 180, 90d supply, fill #1

## 2024-09-25 ENCOUNTER — Other Ambulatory Visit (HOSPITAL_COMMUNITY): Payer: Self-pay

## 2024-09-25 ENCOUNTER — Other Ambulatory Visit: Payer: Self-pay

## 2024-10-01 ENCOUNTER — Other Ambulatory Visit (HOSPITAL_COMMUNITY): Payer: Self-pay

## 2024-10-07 ENCOUNTER — Other Ambulatory Visit (HOSPITAL_COMMUNITY): Payer: Self-pay

## 2024-10-07 ENCOUNTER — Other Ambulatory Visit: Payer: Self-pay

## 2024-10-15 ENCOUNTER — Other Ambulatory Visit (HOSPITAL_COMMUNITY): Payer: Self-pay

## 2024-10-15 ENCOUNTER — Other Ambulatory Visit: Payer: Self-pay

## 2024-10-18 ENCOUNTER — Other Ambulatory Visit (HOSPITAL_COMMUNITY): Payer: Self-pay

## 2024-10-18 MED ORDER — BUPROPION HCL ER (XL) 300 MG PO TB24
300.0000 mg | ORAL_TABLET | Freq: Every morning | ORAL | 1 refills | Status: AC
  Filled 2024-10-18: qty 90, 90d supply, fill #0

## 2024-10-18 MED ORDER — DESVENLAFAXINE SUCCINATE ER 50 MG PO TB24
50.0000 mg | ORAL_TABLET | Freq: Every day | ORAL | 1 refills | Status: AC
  Filled 2024-10-18: qty 90, 90d supply, fill #0

## 2024-10-18 MED ORDER — ALPRAZOLAM 0.5 MG PO TABS
0.5000 mg | ORAL_TABLET | Freq: Every day | ORAL | 0 refills | Status: AC
  Filled 2024-10-18: qty 30, 30d supply, fill #0

## 2024-10-18 MED ORDER — DESVENLAFAXINE SUCCINATE ER 25 MG PO TB24
25.0000 mg | ORAL_TABLET | Freq: Every day | ORAL | 1 refills | Status: AC
  Filled 2024-10-18: qty 90, 90d supply, fill #0

## 2024-10-19 ENCOUNTER — Other Ambulatory Visit (HOSPITAL_COMMUNITY): Payer: Self-pay

## 2024-10-21 ENCOUNTER — Other Ambulatory Visit (HOSPITAL_COMMUNITY): Payer: Self-pay

## 2024-11-01 ENCOUNTER — Other Ambulatory Visit: Payer: Self-pay
